# Patient Record
Sex: Female | Born: 1935 | Race: White | Hispanic: No
Health system: Southern US, Community
[De-identification: ages and names within clinical notes are randomized; demographics above are authoritative.]

---

## 1997-10-17 ENCOUNTER — Other Ambulatory Visit: Admission: RE | Admit: 1997-10-17 | Discharge: 1997-10-17 | Payer: Self-pay | Admitting: Gynecology

## 1998-12-18 ENCOUNTER — Other Ambulatory Visit: Admission: RE | Admit: 1998-12-18 | Discharge: 1998-12-18 | Payer: Self-pay | Admitting: Gynecology

## 1999-12-10 ENCOUNTER — Other Ambulatory Visit: Admission: RE | Admit: 1999-12-10 | Discharge: 1999-12-10 | Payer: Self-pay | Admitting: Gynecology

## 2001-03-03 ENCOUNTER — Other Ambulatory Visit: Admission: RE | Admit: 2001-03-03 | Discharge: 2001-03-03 | Payer: Self-pay | Admitting: Gynecology

## 2003-02-08 ENCOUNTER — Ambulatory Visit (HOSPITAL_COMMUNITY): Admission: RE | Admit: 2003-02-08 | Discharge: 2003-02-08 | Payer: Self-pay | Admitting: Surgery

## 2003-02-08 ENCOUNTER — Ambulatory Visit (HOSPITAL_BASED_OUTPATIENT_CLINIC_OR_DEPARTMENT_OTHER): Admission: RE | Admit: 2003-02-08 | Discharge: 2003-02-08 | Payer: Self-pay | Admitting: Surgery

## 2011-04-03 DIAGNOSIS — H40019 Open angle with borderline findings, low risk, unspecified eye: Secondary | ICD-10-CM | POA: Diagnosis not present

## 2011-04-03 DIAGNOSIS — H3530 Unspecified macular degeneration: Secondary | ICD-10-CM | POA: Diagnosis not present

## 2011-04-03 DIAGNOSIS — H269 Unspecified cataract: Secondary | ICD-10-CM | POA: Diagnosis not present

## 2011-04-08 DIAGNOSIS — I4891 Unspecified atrial fibrillation: Secondary | ICD-10-CM | POA: Diagnosis not present

## 2011-08-28 DIAGNOSIS — S61209A Unspecified open wound of unspecified finger without damage to nail, initial encounter: Secondary | ICD-10-CM | POA: Diagnosis not present

## 2011-11-01 DIAGNOSIS — R7309 Other abnormal glucose: Secondary | ICD-10-CM | POA: Diagnosis not present

## 2011-11-01 DIAGNOSIS — E785 Hyperlipidemia, unspecified: Secondary | ICD-10-CM | POA: Diagnosis not present

## 2011-11-01 DIAGNOSIS — R7301 Impaired fasting glucose: Secondary | ICD-10-CM | POA: Diagnosis not present

## 2011-11-01 DIAGNOSIS — Z950 Presence of cardiac pacemaker: Secondary | ICD-10-CM | POA: Diagnosis not present

## 2011-11-01 DIAGNOSIS — I4891 Unspecified atrial fibrillation: Secondary | ICD-10-CM | POA: Diagnosis not present

## 2011-11-01 DIAGNOSIS — I10 Essential (primary) hypertension: Secondary | ICD-10-CM | POA: Diagnosis not present

## 2011-11-01 DIAGNOSIS — E559 Vitamin D deficiency, unspecified: Secondary | ICD-10-CM | POA: Diagnosis not present

## 2012-01-30 DIAGNOSIS — R300 Dysuria: Secondary | ICD-10-CM | POA: Diagnosis not present

## 2012-03-02 DIAGNOSIS — I440 Atrioventricular block, first degree: Secondary | ICD-10-CM | POA: Diagnosis not present

## 2012-03-02 DIAGNOSIS — I4891 Unspecified atrial fibrillation: Secondary | ICD-10-CM | POA: Diagnosis not present

## 2012-03-02 DIAGNOSIS — Z8249 Family history of ischemic heart disease and other diseases of the circulatory system: Secondary | ICD-10-CM | POA: Diagnosis not present

## 2012-03-02 DIAGNOSIS — I10 Essential (primary) hypertension: Secondary | ICD-10-CM | POA: Diagnosis not present

## 2012-03-02 DIAGNOSIS — Z45018 Encounter for adjustment and management of other part of cardiac pacemaker: Secondary | ICD-10-CM | POA: Diagnosis not present

## 2012-03-02 DIAGNOSIS — I491 Atrial premature depolarization: Secondary | ICD-10-CM | POA: Diagnosis not present

## 2012-03-02 DIAGNOSIS — R0789 Other chest pain: Secondary | ICD-10-CM | POA: Diagnosis not present

## 2012-03-04 DIAGNOSIS — R1310 Dysphagia, unspecified: Secondary | ICD-10-CM | POA: Diagnosis not present

## 2012-03-04 DIAGNOSIS — M542 Cervicalgia: Secondary | ICD-10-CM | POA: Diagnosis not present

## 2012-03-04 DIAGNOSIS — R000 Tachycardia, unspecified: Secondary | ICD-10-CM | POA: Diagnosis not present

## 2012-03-16 DIAGNOSIS — I4891 Unspecified atrial fibrillation: Secondary | ICD-10-CM | POA: Diagnosis not present

## 2012-03-16 DIAGNOSIS — I10 Essential (primary) hypertension: Secondary | ICD-10-CM | POA: Diagnosis not present

## 2012-03-16 DIAGNOSIS — I498 Other specified cardiac arrhythmias: Secondary | ICD-10-CM | POA: Diagnosis not present

## 2012-03-16 DIAGNOSIS — Z45018 Encounter for adjustment and management of other part of cardiac pacemaker: Secondary | ICD-10-CM | POA: Diagnosis not present

## 2012-03-16 DIAGNOSIS — Z950 Presence of cardiac pacemaker: Secondary | ICD-10-CM | POA: Diagnosis not present

## 2012-06-25 DIAGNOSIS — Z950 Presence of cardiac pacemaker: Secondary | ICD-10-CM | POA: Diagnosis not present

## 2012-08-06 DIAGNOSIS — I10 Essential (primary) hypertension: Secondary | ICD-10-CM | POA: Diagnosis not present

## 2012-08-06 DIAGNOSIS — R141 Gas pain: Secondary | ICD-10-CM | POA: Diagnosis not present

## 2012-08-06 DIAGNOSIS — I4891 Unspecified atrial fibrillation: Secondary | ICD-10-CM | POA: Diagnosis not present

## 2012-08-06 DIAGNOSIS — R7301 Impaired fasting glucose: Secondary | ICD-10-CM | POA: Diagnosis not present

## 2012-08-06 DIAGNOSIS — E559 Vitamin D deficiency, unspecified: Secondary | ICD-10-CM | POA: Diagnosis not present

## 2012-08-06 DIAGNOSIS — E785 Hyperlipidemia, unspecified: Secondary | ICD-10-CM | POA: Diagnosis not present

## 2012-08-06 DIAGNOSIS — R143 Flatulence: Secondary | ICD-10-CM | POA: Diagnosis not present

## 2012-09-03 DIAGNOSIS — Z1231 Encounter for screening mammogram for malignant neoplasm of breast: Secondary | ICD-10-CM | POA: Diagnosis not present

## 2012-10-26 DIAGNOSIS — Z950 Presence of cardiac pacemaker: Secondary | ICD-10-CM | POA: Diagnosis not present

## 2012-12-03 DIAGNOSIS — H3530 Unspecified macular degeneration: Secondary | ICD-10-CM | POA: Diagnosis not present

## 2012-12-03 DIAGNOSIS — H268 Other specified cataract: Secondary | ICD-10-CM | POA: Diagnosis not present

## 2013-01-19 DIAGNOSIS — R109 Unspecified abdominal pain: Secondary | ICD-10-CM | POA: Diagnosis not present

## 2013-01-26 DIAGNOSIS — Z9889 Other specified postprocedural states: Secondary | ICD-10-CM | POA: Diagnosis not present

## 2013-01-26 DIAGNOSIS — R109 Unspecified abdominal pain: Secondary | ICD-10-CM | POA: Diagnosis not present

## 2013-01-26 DIAGNOSIS — IMO0002 Reserved for concepts with insufficient information to code with codable children: Secondary | ICD-10-CM | POA: Diagnosis not present

## 2013-01-26 DIAGNOSIS — G8929 Other chronic pain: Secondary | ICD-10-CM | POA: Diagnosis not present

## 2013-01-26 DIAGNOSIS — D3500 Benign neoplasm of unspecified adrenal gland: Secondary | ICD-10-CM | POA: Diagnosis not present

## 2013-01-26 DIAGNOSIS — I700 Atherosclerosis of aorta: Secondary | ICD-10-CM | POA: Diagnosis not present

## 2013-01-26 DIAGNOSIS — N289 Disorder of kidney and ureter, unspecified: Secondary | ICD-10-CM | POA: Diagnosis not present

## 2013-02-04 DIAGNOSIS — K7689 Other specified diseases of liver: Secondary | ICD-10-CM | POA: Diagnosis not present

## 2013-02-04 DIAGNOSIS — R109 Unspecified abdominal pain: Secondary | ICD-10-CM | POA: Diagnosis not present

## 2013-02-04 DIAGNOSIS — D3500 Benign neoplasm of unspecified adrenal gland: Secondary | ICD-10-CM | POA: Diagnosis not present

## 2013-02-09 DIAGNOSIS — E119 Type 2 diabetes mellitus without complications: Secondary | ICD-10-CM | POA: Diagnosis not present

## 2013-02-09 DIAGNOSIS — R109 Unspecified abdominal pain: Secondary | ICD-10-CM | POA: Diagnosis not present

## 2013-02-09 DIAGNOSIS — R198 Other specified symptoms and signs involving the digestive system and abdomen: Secondary | ICD-10-CM | POA: Diagnosis not present

## 2013-02-09 DIAGNOSIS — Z006 Encounter for examination for normal comparison and control in clinical research program: Secondary | ICD-10-CM | POA: Diagnosis not present

## 2013-02-22 DIAGNOSIS — N949 Unspecified condition associated with female genital organs and menstrual cycle: Secondary | ICD-10-CM | POA: Diagnosis not present

## 2013-03-05 DIAGNOSIS — R109 Unspecified abdominal pain: Secondary | ICD-10-CM | POA: Diagnosis not present

## 2013-03-05 DIAGNOSIS — F341 Dysthymic disorder: Secondary | ICD-10-CM | POA: Diagnosis not present

## 2013-03-05 DIAGNOSIS — R7309 Other abnormal glucose: Secondary | ICD-10-CM | POA: Diagnosis not present

## 2013-03-05 DIAGNOSIS — Z79899 Other long term (current) drug therapy: Secondary | ICD-10-CM | POA: Diagnosis not present

## 2013-03-11 DIAGNOSIS — Z950 Presence of cardiac pacemaker: Secondary | ICD-10-CM | POA: Diagnosis not present

## 2013-03-11 DIAGNOSIS — I4891 Unspecified atrial fibrillation: Secondary | ICD-10-CM | POA: Diagnosis not present

## 2013-03-11 DIAGNOSIS — I10 Essential (primary) hypertension: Secondary | ICD-10-CM | POA: Diagnosis not present

## 2013-03-11 DIAGNOSIS — I498 Other specified cardiac arrhythmias: Secondary | ICD-10-CM | POA: Diagnosis not present

## 2013-03-17 DIAGNOSIS — N390 Urinary tract infection, site not specified: Secondary | ICD-10-CM | POA: Diagnosis not present

## 2013-03-17 DIAGNOSIS — A499 Bacterial infection, unspecified: Secondary | ICD-10-CM | POA: Diagnosis not present

## 2013-03-23 DIAGNOSIS — N390 Urinary tract infection, site not specified: Secondary | ICD-10-CM | POA: Diagnosis not present

## 2013-03-27 DIAGNOSIS — R509 Fever, unspecified: Secondary | ICD-10-CM | POA: Diagnosis not present

## 2013-03-27 DIAGNOSIS — R059 Cough, unspecified: Secondary | ICD-10-CM | POA: Diagnosis not present

## 2013-03-27 DIAGNOSIS — R0602 Shortness of breath: Secondary | ICD-10-CM | POA: Diagnosis not present

## 2013-03-27 DIAGNOSIS — J069 Acute upper respiratory infection, unspecified: Secondary | ICD-10-CM | POA: Diagnosis not present

## 2013-03-27 DIAGNOSIS — R6889 Other general symptoms and signs: Secondary | ICD-10-CM | POA: Diagnosis not present

## 2013-03-27 DIAGNOSIS — R05 Cough: Secondary | ICD-10-CM | POA: Diagnosis not present

## 2013-04-30 DIAGNOSIS — R3989 Other symptoms and signs involving the genitourinary system: Secondary | ICD-10-CM | POA: Diagnosis not present

## 2013-05-07 DIAGNOSIS — Z1211 Encounter for screening for malignant neoplasm of colon: Secondary | ICD-10-CM | POA: Diagnosis not present

## 2013-05-07 DIAGNOSIS — Z86010 Personal history of colonic polyps: Secondary | ICD-10-CM | POA: Diagnosis not present

## 2013-05-19 DIAGNOSIS — N952 Postmenopausal atrophic vaginitis: Secondary | ICD-10-CM | POA: Diagnosis not present

## 2013-05-19 DIAGNOSIS — R109 Unspecified abdominal pain: Secondary | ICD-10-CM | POA: Diagnosis not present

## 2013-06-01 DIAGNOSIS — H3530 Unspecified macular degeneration: Secondary | ICD-10-CM | POA: Diagnosis not present

## 2013-06-01 DIAGNOSIS — H353190 Nonexudative age-related macular degeneration, unspecified eye, stage unspecified: Secondary | ICD-10-CM | POA: Diagnosis not present

## 2013-06-01 DIAGNOSIS — H268 Other specified cataract: Secondary | ICD-10-CM | POA: Diagnosis not present

## 2013-06-18 DIAGNOSIS — I495 Sick sinus syndrome: Secondary | ICD-10-CM | POA: Diagnosis not present

## 2013-06-28 DIAGNOSIS — Z86010 Personal history of colonic polyps: Secondary | ICD-10-CM | POA: Diagnosis not present

## 2013-06-28 DIAGNOSIS — Z1211 Encounter for screening for malignant neoplasm of colon: Secondary | ICD-10-CM | POA: Diagnosis not present

## 2013-06-28 DIAGNOSIS — K5730 Diverticulosis of large intestine without perforation or abscess without bleeding: Secondary | ICD-10-CM | POA: Diagnosis not present

## 2013-06-28 DIAGNOSIS — D126 Benign neoplasm of colon, unspecified: Secondary | ICD-10-CM | POA: Diagnosis not present

## 2013-06-30 DIAGNOSIS — IMO0002 Reserved for concepts with insufficient information to code with codable children: Secondary | ICD-10-CM | POA: Diagnosis not present

## 2013-08-06 DIAGNOSIS — I10 Essential (primary) hypertension: Secondary | ICD-10-CM | POA: Diagnosis not present

## 2013-08-06 DIAGNOSIS — N949 Unspecified condition associated with female genital organs and menstrual cycle: Secondary | ICD-10-CM | POA: Diagnosis not present

## 2013-08-06 DIAGNOSIS — R000 Tachycardia, unspecified: Secondary | ICD-10-CM | POA: Diagnosis not present

## 2013-08-06 DIAGNOSIS — R7309 Other abnormal glucose: Secondary | ICD-10-CM | POA: Diagnosis not present

## 2013-08-06 DIAGNOSIS — E559 Vitamin D deficiency, unspecified: Secondary | ICD-10-CM | POA: Diagnosis not present

## 2013-08-06 DIAGNOSIS — R5381 Other malaise: Secondary | ICD-10-CM | POA: Diagnosis not present

## 2013-08-06 DIAGNOSIS — F341 Dysthymic disorder: Secondary | ICD-10-CM | POA: Diagnosis not present

## 2013-08-06 DIAGNOSIS — D3500 Benign neoplasm of unspecified adrenal gland: Secondary | ICD-10-CM | POA: Diagnosis not present

## 2013-08-06 DIAGNOSIS — E7800 Pure hypercholesterolemia, unspecified: Secondary | ICD-10-CM | POA: Diagnosis not present

## 2013-09-03 DIAGNOSIS — K7689 Other specified diseases of liver: Secondary | ICD-10-CM | POA: Diagnosis not present

## 2013-09-03 DIAGNOSIS — N289 Disorder of kidney and ureter, unspecified: Secondary | ICD-10-CM | POA: Diagnosis not present

## 2013-10-08 DIAGNOSIS — I495 Sick sinus syndrome: Secondary | ICD-10-CM | POA: Diagnosis not present

## 2013-10-15 DIAGNOSIS — E7800 Pure hypercholesterolemia, unspecified: Secondary | ICD-10-CM | POA: Diagnosis not present

## 2013-10-15 DIAGNOSIS — E559 Vitamin D deficiency, unspecified: Secondary | ICD-10-CM | POA: Diagnosis not present

## 2013-10-29 DIAGNOSIS — Z1231 Encounter for screening mammogram for malignant neoplasm of breast: Secondary | ICD-10-CM | POA: Diagnosis not present

## 2013-12-14 DIAGNOSIS — R109 Unspecified abdominal pain: Secondary | ICD-10-CM | POA: Diagnosis not present

## 2013-12-14 DIAGNOSIS — I10 Essential (primary) hypertension: Secondary | ICD-10-CM | POA: Diagnosis not present

## 2013-12-14 DIAGNOSIS — R110 Nausea: Secondary | ICD-10-CM | POA: Diagnosis not present

## 2013-12-30 DIAGNOSIS — H3530 Unspecified macular degeneration: Secondary | ICD-10-CM | POA: Diagnosis not present

## 2013-12-30 DIAGNOSIS — H25813 Combined forms of age-related cataract, bilateral: Secondary | ICD-10-CM | POA: Diagnosis not present

## 2013-12-30 DIAGNOSIS — H2589 Other age-related cataract: Secondary | ICD-10-CM | POA: Diagnosis not present

## 2014-01-14 DIAGNOSIS — I495 Sick sinus syndrome: Secondary | ICD-10-CM | POA: Diagnosis not present

## 2014-05-28 DIAGNOSIS — J209 Acute bronchitis, unspecified: Secondary | ICD-10-CM | POA: Diagnosis not present

## 2014-06-17 DIAGNOSIS — Z950 Presence of cardiac pacemaker: Secondary | ICD-10-CM | POA: Diagnosis not present

## 2014-06-17 DIAGNOSIS — R001 Bradycardia, unspecified: Secondary | ICD-10-CM | POA: Diagnosis not present

## 2014-06-17 DIAGNOSIS — I480 Paroxysmal atrial fibrillation: Secondary | ICD-10-CM | POA: Diagnosis not present

## 2014-06-17 DIAGNOSIS — I471 Supraventricular tachycardia: Secondary | ICD-10-CM | POA: Diagnosis not present

## 2014-06-17 DIAGNOSIS — I10 Essential (primary) hypertension: Secondary | ICD-10-CM | POA: Diagnosis not present

## 2014-06-22 DIAGNOSIS — E559 Vitamin D deficiency, unspecified: Secondary | ICD-10-CM | POA: Diagnosis not present

## 2014-06-22 DIAGNOSIS — R413 Other amnesia: Secondary | ICD-10-CM | POA: Diagnosis not present

## 2014-06-22 DIAGNOSIS — E780 Pure hypercholesterolemia: Secondary | ICD-10-CM | POA: Diagnosis not present

## 2014-06-22 DIAGNOSIS — Z79899 Other long term (current) drug therapy: Secondary | ICD-10-CM | POA: Diagnosis not present

## 2014-06-22 DIAGNOSIS — F418 Other specified anxiety disorders: Secondary | ICD-10-CM | POA: Diagnosis not present

## 2014-06-22 DIAGNOSIS — Z9181 History of falling: Secondary | ICD-10-CM | POA: Diagnosis not present

## 2014-06-22 DIAGNOSIS — I10 Essential (primary) hypertension: Secondary | ICD-10-CM | POA: Diagnosis not present

## 2014-06-22 DIAGNOSIS — R7309 Other abnormal glucose: Secondary | ICD-10-CM | POA: Diagnosis not present

## 2014-06-22 DIAGNOSIS — Z1389 Encounter for screening for other disorder: Secondary | ICD-10-CM | POA: Diagnosis not present

## 2014-07-29 DIAGNOSIS — G3184 Mild cognitive impairment, so stated: Secondary | ICD-10-CM | POA: Diagnosis not present

## 2014-07-29 DIAGNOSIS — Z6828 Body mass index (BMI) 28.0-28.9, adult: Secondary | ICD-10-CM | POA: Diagnosis not present

## 2014-11-11 DIAGNOSIS — I4891 Unspecified atrial fibrillation: Secondary | ICD-10-CM | POA: Diagnosis not present

## 2014-11-11 DIAGNOSIS — E780 Pure hypercholesterolemia: Secondary | ICD-10-CM | POA: Diagnosis not present

## 2014-11-11 DIAGNOSIS — Z9181 History of falling: Secondary | ICD-10-CM | POA: Diagnosis not present

## 2014-11-11 DIAGNOSIS — D692 Other nonthrombocytopenic purpura: Secondary | ICD-10-CM | POA: Diagnosis not present

## 2014-11-11 DIAGNOSIS — I10 Essential (primary) hypertension: Secondary | ICD-10-CM | POA: Diagnosis not present

## 2014-11-11 DIAGNOSIS — E559 Vitamin D deficiency, unspecified: Secondary | ICD-10-CM | POA: Diagnosis not present

## 2014-11-11 DIAGNOSIS — F418 Other specified anxiety disorders: Secondary | ICD-10-CM | POA: Diagnosis not present

## 2014-11-11 DIAGNOSIS — G3184 Mild cognitive impairment, so stated: Secondary | ICD-10-CM | POA: Diagnosis not present

## 2014-11-11 DIAGNOSIS — Z79899 Other long term (current) drug therapy: Secondary | ICD-10-CM | POA: Diagnosis not present

## 2014-11-18 DIAGNOSIS — Z1231 Encounter for screening mammogram for malignant neoplasm of breast: Secondary | ICD-10-CM | POA: Diagnosis not present

## 2014-11-25 DIAGNOSIS — E875 Hyperkalemia: Secondary | ICD-10-CM | POA: Diagnosis not present

## 2014-12-29 DIAGNOSIS — Z6828 Body mass index (BMI) 28.0-28.9, adult: Secondary | ICD-10-CM | POA: Diagnosis not present

## 2014-12-29 DIAGNOSIS — R1013 Epigastric pain: Secondary | ICD-10-CM | POA: Diagnosis not present

## 2014-12-29 DIAGNOSIS — R110 Nausea: Secondary | ICD-10-CM | POA: Diagnosis not present

## 2015-01-05 DIAGNOSIS — H3530 Unspecified macular degeneration: Secondary | ICD-10-CM | POA: Diagnosis not present

## 2015-01-05 DIAGNOSIS — H2589 Other age-related cataract: Secondary | ICD-10-CM | POA: Diagnosis not present

## 2015-01-05 DIAGNOSIS — H25813 Combined forms of age-related cataract, bilateral: Secondary | ICD-10-CM | POA: Diagnosis not present

## 2015-01-10 DIAGNOSIS — I495 Sick sinus syndrome: Secondary | ICD-10-CM | POA: Diagnosis not present

## 2015-01-27 DIAGNOSIS — H6123 Impacted cerumen, bilateral: Secondary | ICD-10-CM | POA: Diagnosis not present

## 2015-01-27 DIAGNOSIS — H81399 Other peripheral vertigo, unspecified ear: Secondary | ICD-10-CM | POA: Diagnosis not present

## 2015-05-19 DIAGNOSIS — Z6828 Body mass index (BMI) 28.0-28.9, adult: Secondary | ICD-10-CM | POA: Diagnosis not present

## 2015-05-19 DIAGNOSIS — I10 Essential (primary) hypertension: Secondary | ICD-10-CM | POA: Diagnosis not present

## 2015-05-19 DIAGNOSIS — F418 Other specified anxiety disorders: Secondary | ICD-10-CM | POA: Diagnosis not present

## 2015-05-19 DIAGNOSIS — R7303 Prediabetes: Secondary | ICD-10-CM | POA: Diagnosis not present

## 2015-05-19 DIAGNOSIS — E559 Vitamin D deficiency, unspecified: Secondary | ICD-10-CM | POA: Diagnosis not present

## 2015-05-19 DIAGNOSIS — I4891 Unspecified atrial fibrillation: Secondary | ICD-10-CM | POA: Diagnosis not present

## 2015-05-19 DIAGNOSIS — E7800 Pure hypercholesterolemia, unspecified: Secondary | ICD-10-CM | POA: Diagnosis not present

## 2015-05-19 DIAGNOSIS — G3184 Mild cognitive impairment, so stated: Secondary | ICD-10-CM | POA: Diagnosis not present

## 2015-05-19 DIAGNOSIS — Z79899 Other long term (current) drug therapy: Secondary | ICD-10-CM | POA: Diagnosis not present

## 2015-06-19 DIAGNOSIS — I10 Essential (primary) hypertension: Secondary | ICD-10-CM | POA: Diagnosis not present

## 2015-06-19 DIAGNOSIS — I481 Persistent atrial fibrillation: Secondary | ICD-10-CM | POA: Diagnosis not present

## 2015-06-19 DIAGNOSIS — I499 Cardiac arrhythmia, unspecified: Secondary | ICD-10-CM | POA: Diagnosis not present

## 2015-06-19 DIAGNOSIS — I471 Supraventricular tachycardia: Secondary | ICD-10-CM | POA: Diagnosis not present

## 2015-07-07 DIAGNOSIS — N390 Urinary tract infection, site not specified: Secondary | ICD-10-CM | POA: Diagnosis not present

## 2015-07-07 DIAGNOSIS — Z6828 Body mass index (BMI) 28.0-28.9, adult: Secondary | ICD-10-CM | POA: Diagnosis not present

## 2015-07-07 DIAGNOSIS — R1031 Right lower quadrant pain: Secondary | ICD-10-CM | POA: Diagnosis not present

## 2015-07-07 DIAGNOSIS — Z1389 Encounter for screening for other disorder: Secondary | ICD-10-CM | POA: Diagnosis not present

## 2015-07-11 DIAGNOSIS — R1031 Right lower quadrant pain: Secondary | ICD-10-CM | POA: Diagnosis not present

## 2015-09-29 DIAGNOSIS — R1010 Upper abdominal pain, unspecified: Secondary | ICD-10-CM | POA: Diagnosis not present

## 2015-09-29 DIAGNOSIS — R1084 Generalized abdominal pain: Secondary | ICD-10-CM | POA: Diagnosis not present

## 2015-10-10 DIAGNOSIS — I495 Sick sinus syndrome: Secondary | ICD-10-CM | POA: Diagnosis not present

## 2015-11-03 DIAGNOSIS — F0280 Dementia in other diseases classified elsewhere without behavioral disturbance: Secondary | ICD-10-CM | POA: Diagnosis not present

## 2015-11-03 DIAGNOSIS — G308 Other Alzheimer's disease: Secondary | ICD-10-CM | POA: Diagnosis not present

## 2015-11-21 DIAGNOSIS — G3184 Mild cognitive impairment, so stated: Secondary | ICD-10-CM | POA: Diagnosis not present

## 2015-11-21 DIAGNOSIS — Z1231 Encounter for screening mammogram for malignant neoplasm of breast: Secondary | ICD-10-CM | POA: Diagnosis not present

## 2015-11-21 DIAGNOSIS — Z6828 Body mass index (BMI) 28.0-28.9, adult: Secondary | ICD-10-CM | POA: Diagnosis not present

## 2015-11-21 DIAGNOSIS — F418 Other specified anxiety disorders: Secondary | ICD-10-CM | POA: Diagnosis not present

## 2015-11-21 DIAGNOSIS — Z9181 History of falling: Secondary | ICD-10-CM | POA: Diagnosis not present

## 2015-11-21 DIAGNOSIS — I4891 Unspecified atrial fibrillation: Secondary | ICD-10-CM | POA: Diagnosis not present

## 2015-11-21 DIAGNOSIS — I10 Essential (primary) hypertension: Secondary | ICD-10-CM | POA: Diagnosis not present

## 2015-11-21 DIAGNOSIS — Z79899 Other long term (current) drug therapy: Secondary | ICD-10-CM | POA: Diagnosis not present

## 2015-11-21 DIAGNOSIS — E663 Overweight: Secondary | ICD-10-CM | POA: Diagnosis not present

## 2015-11-21 DIAGNOSIS — E559 Vitamin D deficiency, unspecified: Secondary | ICD-10-CM | POA: Diagnosis not present

## 2015-11-21 DIAGNOSIS — E7800 Pure hypercholesterolemia, unspecified: Secondary | ICD-10-CM | POA: Diagnosis not present

## 2016-01-11 DIAGNOSIS — H353131 Nonexudative age-related macular degeneration, bilateral, early dry stage: Secondary | ICD-10-CM | POA: Diagnosis not present

## 2016-01-11 DIAGNOSIS — H2589 Other age-related cataract: Secondary | ICD-10-CM | POA: Diagnosis not present

## 2016-01-11 DIAGNOSIS — H25813 Combined forms of age-related cataract, bilateral: Secondary | ICD-10-CM | POA: Diagnosis not present

## 2016-01-17 DIAGNOSIS — K7689 Other specified diseases of liver: Secondary | ICD-10-CM | POA: Diagnosis not present

## 2016-01-17 DIAGNOSIS — R1011 Right upper quadrant pain: Secondary | ICD-10-CM | POA: Diagnosis not present

## 2016-01-17 DIAGNOSIS — R1013 Epigastric pain: Secondary | ICD-10-CM | POA: Diagnosis not present

## 2016-01-17 DIAGNOSIS — Z79899 Other long term (current) drug therapy: Secondary | ICD-10-CM | POA: Diagnosis not present

## 2016-01-17 DIAGNOSIS — Z6828 Body mass index (BMI) 28.0-28.9, adult: Secondary | ICD-10-CM | POA: Diagnosis not present

## 2016-01-22 DIAGNOSIS — R1013 Epigastric pain: Secondary | ICD-10-CM | POA: Diagnosis not present

## 2016-01-22 DIAGNOSIS — R1011 Right upper quadrant pain: Secondary | ICD-10-CM | POA: Diagnosis not present

## 2016-01-22 DIAGNOSIS — R109 Unspecified abdominal pain: Secondary | ICD-10-CM | POA: Diagnosis not present

## 2016-01-29 DIAGNOSIS — R1013 Epigastric pain: Secondary | ICD-10-CM | POA: Diagnosis not present

## 2016-01-29 DIAGNOSIS — K828 Other specified diseases of gallbladder: Secondary | ICD-10-CM | POA: Diagnosis not present

## 2016-01-31 DIAGNOSIS — I480 Paroxysmal atrial fibrillation: Secondary | ICD-10-CM | POA: Diagnosis not present

## 2016-01-31 DIAGNOSIS — I495 Sick sinus syndrome: Secondary | ICD-10-CM | POA: Diagnosis not present

## 2016-01-31 DIAGNOSIS — Z01818 Encounter for other preprocedural examination: Secondary | ICD-10-CM | POA: Diagnosis not present

## 2016-01-31 DIAGNOSIS — I471 Supraventricular tachycardia: Secondary | ICD-10-CM | POA: Diagnosis not present

## 2016-01-31 DIAGNOSIS — I10 Essential (primary) hypertension: Secondary | ICD-10-CM | POA: Diagnosis not present

## 2016-01-31 DIAGNOSIS — Z950 Presence of cardiac pacemaker: Secondary | ICD-10-CM | POA: Diagnosis not present

## 2016-02-07 DIAGNOSIS — F329 Major depressive disorder, single episode, unspecified: Secondary | ICD-10-CM | POA: Diagnosis not present

## 2016-02-07 DIAGNOSIS — F419 Anxiety disorder, unspecified: Secondary | ICD-10-CM | POA: Diagnosis not present

## 2016-02-07 DIAGNOSIS — F0390 Unspecified dementia without behavioral disturbance: Secondary | ICD-10-CM | POA: Diagnosis not present

## 2016-02-07 DIAGNOSIS — E7800 Pure hypercholesterolemia, unspecified: Secondary | ICD-10-CM | POA: Diagnosis not present

## 2016-02-07 DIAGNOSIS — Z87891 Personal history of nicotine dependence: Secondary | ICD-10-CM | POA: Diagnosis not present

## 2016-02-07 DIAGNOSIS — Z950 Presence of cardiac pacemaker: Secondary | ICD-10-CM | POA: Diagnosis not present

## 2016-02-07 DIAGNOSIS — I4891 Unspecified atrial fibrillation: Secondary | ICD-10-CM | POA: Diagnosis not present

## 2016-02-07 DIAGNOSIS — K810 Acute cholecystitis: Secondary | ICD-10-CM | POA: Diagnosis not present

## 2016-02-07 DIAGNOSIS — K828 Other specified diseases of gallbladder: Secondary | ICD-10-CM | POA: Diagnosis not present

## 2016-02-07 DIAGNOSIS — Z79899 Other long term (current) drug therapy: Secondary | ICD-10-CM | POA: Diagnosis not present

## 2016-02-07 DIAGNOSIS — I10 Essential (primary) hypertension: Secondary | ICD-10-CM | POA: Diagnosis not present

## 2016-02-07 DIAGNOSIS — K811 Chronic cholecystitis: Secondary | ICD-10-CM | POA: Diagnosis not present

## 2016-03-01 DIAGNOSIS — H2511 Age-related nuclear cataract, right eye: Secondary | ICD-10-CM | POA: Diagnosis not present

## 2016-05-03 DIAGNOSIS — R140 Abdominal distension (gaseous): Secondary | ICD-10-CM | POA: Diagnosis not present

## 2016-05-03 DIAGNOSIS — R1084 Generalized abdominal pain: Secondary | ICD-10-CM | POA: Diagnosis not present

## 2016-05-08 DIAGNOSIS — G308 Other Alzheimer's disease: Secondary | ICD-10-CM | POA: Diagnosis not present

## 2016-05-08 DIAGNOSIS — F0280 Dementia in other diseases classified elsewhere without behavioral disturbance: Secondary | ICD-10-CM | POA: Diagnosis not present

## 2016-05-23 DIAGNOSIS — G3184 Mild cognitive impairment, so stated: Secondary | ICD-10-CM | POA: Diagnosis not present

## 2016-05-23 DIAGNOSIS — E7800 Pure hypercholesterolemia, unspecified: Secondary | ICD-10-CM | POA: Diagnosis not present

## 2016-05-23 DIAGNOSIS — F418 Other specified anxiety disorders: Secondary | ICD-10-CM | POA: Diagnosis not present

## 2016-05-23 DIAGNOSIS — E559 Vitamin D deficiency, unspecified: Secondary | ICD-10-CM | POA: Diagnosis not present

## 2016-05-23 DIAGNOSIS — I4891 Unspecified atrial fibrillation: Secondary | ICD-10-CM | POA: Diagnosis not present

## 2016-05-23 DIAGNOSIS — I10 Essential (primary) hypertension: Secondary | ICD-10-CM | POA: Diagnosis not present

## 2016-05-28 DIAGNOSIS — R1013 Epigastric pain: Secondary | ICD-10-CM | POA: Diagnosis not present

## 2016-05-29 DIAGNOSIS — R1013 Epigastric pain: Secondary | ICD-10-CM | POA: Diagnosis not present

## 2016-05-31 ENCOUNTER — Encounter: Payer: Self-pay | Admitting: Gastroenterology

## 2016-06-11 ENCOUNTER — Ambulatory Visit: Payer: Self-pay | Admitting: Gastroenterology

## 2016-06-18 DIAGNOSIS — G308 Other Alzheimer's disease: Secondary | ICD-10-CM | POA: Diagnosis not present

## 2016-06-18 DIAGNOSIS — I480 Paroxysmal atrial fibrillation: Secondary | ICD-10-CM | POA: Diagnosis not present

## 2016-06-18 DIAGNOSIS — R001 Bradycardia, unspecified: Secondary | ICD-10-CM | POA: Diagnosis not present

## 2016-06-18 DIAGNOSIS — I471 Supraventricular tachycardia: Secondary | ICD-10-CM | POA: Diagnosis not present

## 2016-06-18 DIAGNOSIS — I499 Cardiac arrhythmia, unspecified: Secondary | ICD-10-CM | POA: Diagnosis not present

## 2016-06-18 DIAGNOSIS — E7800 Pure hypercholesterolemia, unspecified: Secondary | ICD-10-CM | POA: Diagnosis not present

## 2016-06-18 DIAGNOSIS — Z950 Presence of cardiac pacemaker: Secondary | ICD-10-CM | POA: Diagnosis not present

## 2016-06-18 DIAGNOSIS — E785 Hyperlipidemia, unspecified: Secondary | ICD-10-CM | POA: Diagnosis not present

## 2016-06-18 DIAGNOSIS — F0280 Dementia in other diseases classified elsewhere without behavioral disturbance: Secondary | ICD-10-CM | POA: Diagnosis not present

## 2016-06-18 DIAGNOSIS — I495 Sick sinus syndrome: Secondary | ICD-10-CM | POA: Diagnosis not present

## 2016-06-18 DIAGNOSIS — I10 Essential (primary) hypertension: Secondary | ICD-10-CM | POA: Diagnosis not present

## 2016-08-14 DIAGNOSIS — I10 Essential (primary) hypertension: Secondary | ICD-10-CM | POA: Diagnosis not present

## 2016-08-14 DIAGNOSIS — Z6828 Body mass index (BMI) 28.0-28.9, adult: Secondary | ICD-10-CM | POA: Diagnosis not present

## 2016-08-14 DIAGNOSIS — R109 Unspecified abdominal pain: Secondary | ICD-10-CM | POA: Diagnosis not present

## 2016-08-14 DIAGNOSIS — Z9181 History of falling: Secondary | ICD-10-CM | POA: Diagnosis not present

## 2016-08-14 DIAGNOSIS — F418 Other specified anxiety disorders: Secondary | ICD-10-CM | POA: Diagnosis not present

## 2016-08-14 DIAGNOSIS — Z1389 Encounter for screening for other disorder: Secondary | ICD-10-CM | POA: Diagnosis not present

## 2016-08-25 DIAGNOSIS — T7840XA Allergy, unspecified, initial encounter: Secondary | ICD-10-CM | POA: Diagnosis not present

## 2016-08-25 DIAGNOSIS — L2089 Other atopic dermatitis: Secondary | ICD-10-CM | POA: Diagnosis not present

## 2016-08-26 DIAGNOSIS — Z79899 Other long term (current) drug therapy: Secondary | ICD-10-CM | POA: Diagnosis not present

## 2016-08-26 DIAGNOSIS — D72829 Elevated white blood cell count, unspecified: Secondary | ICD-10-CM | POA: Diagnosis not present

## 2016-08-26 DIAGNOSIS — T7840XA Allergy, unspecified, initial encounter: Secondary | ICD-10-CM | POA: Diagnosis not present

## 2016-08-26 DIAGNOSIS — Z7952 Long term (current) use of systemic steroids: Secondary | ICD-10-CM | POA: Diagnosis not present

## 2016-08-26 DIAGNOSIS — I10 Essential (primary) hypertension: Secondary | ICD-10-CM | POA: Diagnosis not present

## 2016-08-26 DIAGNOSIS — R21 Rash and other nonspecific skin eruption: Secondary | ICD-10-CM | POA: Diagnosis not present

## 2016-08-26 DIAGNOSIS — Z7982 Long term (current) use of aspirin: Secondary | ICD-10-CM | POA: Diagnosis not present

## 2016-08-27 DIAGNOSIS — I10 Essential (primary) hypertension: Secondary | ICD-10-CM | POA: Diagnosis not present

## 2016-08-27 DIAGNOSIS — Z8639 Personal history of other endocrine, nutritional and metabolic disease: Secondary | ICD-10-CM | POA: Diagnosis not present

## 2016-08-27 DIAGNOSIS — Z8659 Personal history of other mental and behavioral disorders: Secondary | ICD-10-CM | POA: Diagnosis not present

## 2016-08-27 DIAGNOSIS — T7840XA Allergy, unspecified, initial encounter: Secondary | ICD-10-CM | POA: Diagnosis not present

## 2016-08-27 DIAGNOSIS — Z8669 Personal history of other diseases of the nervous system and sense organs: Secondary | ICD-10-CM | POA: Diagnosis not present

## 2016-08-27 DIAGNOSIS — R21 Rash and other nonspecific skin eruption: Secondary | ICD-10-CM | POA: Diagnosis not present

## 2016-09-03 DIAGNOSIS — F418 Other specified anxiety disorders: Secondary | ICD-10-CM | POA: Diagnosis not present

## 2016-09-03 DIAGNOSIS — Z6827 Body mass index (BMI) 27.0-27.9, adult: Secondary | ICD-10-CM | POA: Diagnosis not present

## 2016-09-03 DIAGNOSIS — T783XXA Angioneurotic edema, initial encounter: Secondary | ICD-10-CM | POA: Diagnosis not present

## 2016-09-09 DIAGNOSIS — Z136 Encounter for screening for cardiovascular disorders: Secondary | ICD-10-CM | POA: Diagnosis not present

## 2016-09-09 DIAGNOSIS — Z9181 History of falling: Secondary | ICD-10-CM | POA: Diagnosis not present

## 2016-09-09 DIAGNOSIS — Z23 Encounter for immunization: Secondary | ICD-10-CM | POA: Diagnosis not present

## 2016-09-09 DIAGNOSIS — N959 Unspecified menopausal and perimenopausal disorder: Secondary | ICD-10-CM | POA: Diagnosis not present

## 2016-09-09 DIAGNOSIS — F329 Major depressive disorder, single episode, unspecified: Secondary | ICD-10-CM | POA: Diagnosis not present

## 2016-09-09 DIAGNOSIS — E785 Hyperlipidemia, unspecified: Secondary | ICD-10-CM | POA: Diagnosis not present

## 2016-09-09 DIAGNOSIS — Z0000 Encounter for general adult medical examination without abnormal findings: Secondary | ICD-10-CM | POA: Diagnosis not present

## 2016-09-09 DIAGNOSIS — Z1389 Encounter for screening for other disorder: Secondary | ICD-10-CM | POA: Diagnosis not present

## 2016-09-09 DIAGNOSIS — Z1231 Encounter for screening mammogram for malignant neoplasm of breast: Secondary | ICD-10-CM | POA: Diagnosis not present

## 2016-09-27 DIAGNOSIS — M8580 Other specified disorders of bone density and structure, unspecified site: Secondary | ICD-10-CM | POA: Diagnosis not present

## 2016-09-27 DIAGNOSIS — Z780 Asymptomatic menopausal state: Secondary | ICD-10-CM | POA: Diagnosis not present

## 2016-09-27 DIAGNOSIS — Z1231 Encounter for screening mammogram for malignant neoplasm of breast: Secondary | ICD-10-CM | POA: Diagnosis not present

## 2016-09-27 DIAGNOSIS — M85852 Other specified disorders of bone density and structure, left thigh: Secondary | ICD-10-CM | POA: Diagnosis not present

## 2016-10-17 DIAGNOSIS — H25812 Combined forms of age-related cataract, left eye: Secondary | ICD-10-CM | POA: Diagnosis not present

## 2016-10-17 DIAGNOSIS — H268 Other specified cataract: Secondary | ICD-10-CM | POA: Diagnosis not present

## 2016-11-01 DIAGNOSIS — Z950 Presence of cardiac pacemaker: Secondary | ICD-10-CM | POA: Diagnosis not present

## 2016-11-01 DIAGNOSIS — I10 Essential (primary) hypertension: Secondary | ICD-10-CM | POA: Diagnosis not present

## 2016-11-01 DIAGNOSIS — K219 Gastro-esophageal reflux disease without esophagitis: Secondary | ICD-10-CM | POA: Diagnosis not present

## 2016-11-01 DIAGNOSIS — Z79899 Other long term (current) drug therapy: Secondary | ICD-10-CM | POA: Diagnosis not present

## 2016-11-01 DIAGNOSIS — F0280 Dementia in other diseases classified elsewhere without behavioral disturbance: Secondary | ICD-10-CM | POA: Diagnosis not present

## 2016-11-01 DIAGNOSIS — Z7982 Long term (current) use of aspirin: Secondary | ICD-10-CM | POA: Diagnosis not present

## 2016-11-01 DIAGNOSIS — H2512 Age-related nuclear cataract, left eye: Secondary | ICD-10-CM | POA: Diagnosis not present

## 2016-11-01 DIAGNOSIS — G309 Alzheimer's disease, unspecified: Secondary | ICD-10-CM | POA: Diagnosis not present

## 2016-11-01 DIAGNOSIS — I4510 Unspecified right bundle-branch block: Secondary | ICD-10-CM | POA: Diagnosis not present

## 2016-11-01 DIAGNOSIS — Z87891 Personal history of nicotine dependence: Secondary | ICD-10-CM | POA: Diagnosis not present

## 2016-11-01 DIAGNOSIS — I4891 Unspecified atrial fibrillation: Secondary | ICD-10-CM | POA: Diagnosis not present

## 2016-11-06 DIAGNOSIS — F0280 Dementia in other diseases classified elsewhere without behavioral disturbance: Secondary | ICD-10-CM | POA: Diagnosis not present

## 2016-11-06 DIAGNOSIS — F329 Major depressive disorder, single episode, unspecified: Secondary | ICD-10-CM | POA: Diagnosis not present

## 2016-11-06 DIAGNOSIS — G301 Alzheimer's disease with late onset: Secondary | ICD-10-CM | POA: Diagnosis not present

## 2016-11-06 DIAGNOSIS — F411 Generalized anxiety disorder: Secondary | ICD-10-CM | POA: Diagnosis not present

## 2016-11-06 DIAGNOSIS — G4719 Other hypersomnia: Secondary | ICD-10-CM | POA: Diagnosis not present

## 2016-11-25 DIAGNOSIS — G3184 Mild cognitive impairment, so stated: Secondary | ICD-10-CM | POA: Diagnosis not present

## 2016-11-25 DIAGNOSIS — E7800 Pure hypercholesterolemia, unspecified: Secondary | ICD-10-CM | POA: Diagnosis not present

## 2016-11-25 DIAGNOSIS — E559 Vitamin D deficiency, unspecified: Secondary | ICD-10-CM | POA: Diagnosis not present

## 2016-11-25 DIAGNOSIS — I4891 Unspecified atrial fibrillation: Secondary | ICD-10-CM | POA: Diagnosis not present

## 2016-11-25 DIAGNOSIS — Z6827 Body mass index (BMI) 27.0-27.9, adult: Secondary | ICD-10-CM | POA: Diagnosis not present

## 2016-11-25 DIAGNOSIS — Z79899 Other long term (current) drug therapy: Secondary | ICD-10-CM | POA: Diagnosis not present

## 2016-11-25 DIAGNOSIS — F418 Other specified anxiety disorders: Secondary | ICD-10-CM | POA: Diagnosis not present

## 2016-12-24 DIAGNOSIS — G4719 Other hypersomnia: Secondary | ICD-10-CM | POA: Diagnosis not present

## 2016-12-24 DIAGNOSIS — G301 Alzheimer's disease with late onset: Secondary | ICD-10-CM | POA: Diagnosis not present

## 2016-12-24 DIAGNOSIS — G3184 Mild cognitive impairment, so stated: Secondary | ICD-10-CM | POA: Diagnosis not present

## 2016-12-24 DIAGNOSIS — F0280 Dementia in other diseases classified elsewhere without behavioral disturbance: Secondary | ICD-10-CM | POA: Diagnosis not present

## 2016-12-24 DIAGNOSIS — F411 Generalized anxiety disorder: Secondary | ICD-10-CM | POA: Diagnosis not present

## 2017-02-21 DIAGNOSIS — Z6826 Body mass index (BMI) 26.0-26.9, adult: Secondary | ICD-10-CM | POA: Diagnosis not present

## 2017-02-21 DIAGNOSIS — J189 Pneumonia, unspecified organism: Secondary | ICD-10-CM | POA: Diagnosis not present

## 2017-05-16 DIAGNOSIS — Z950 Presence of cardiac pacemaker: Secondary | ICD-10-CM | POA: Diagnosis not present

## 2017-05-16 DIAGNOSIS — I495 Sick sinus syndrome: Secondary | ICD-10-CM | POA: Diagnosis not present

## 2017-05-26 DIAGNOSIS — G308 Other Alzheimer's disease: Secondary | ICD-10-CM | POA: Diagnosis not present

## 2017-05-26 DIAGNOSIS — G3184 Mild cognitive impairment, so stated: Secondary | ICD-10-CM | POA: Diagnosis not present

## 2017-05-26 DIAGNOSIS — G4719 Other hypersomnia: Secondary | ICD-10-CM | POA: Diagnosis not present

## 2017-05-26 DIAGNOSIS — F411 Generalized anxiety disorder: Secondary | ICD-10-CM | POA: Diagnosis not present

## 2017-05-26 DIAGNOSIS — F329 Major depressive disorder, single episode, unspecified: Secondary | ICD-10-CM | POA: Diagnosis not present

## 2017-05-26 DIAGNOSIS — F0280 Dementia in other diseases classified elsewhere without behavioral disturbance: Secondary | ICD-10-CM | POA: Diagnosis not present

## 2017-06-02 DIAGNOSIS — E7800 Pure hypercholesterolemia, unspecified: Secondary | ICD-10-CM | POA: Diagnosis not present

## 2017-06-02 DIAGNOSIS — G3184 Mild cognitive impairment, so stated: Secondary | ICD-10-CM | POA: Diagnosis not present

## 2017-06-02 DIAGNOSIS — E559 Vitamin D deficiency, unspecified: Secondary | ICD-10-CM | POA: Diagnosis not present

## 2017-06-02 DIAGNOSIS — F418 Other specified anxiety disorders: Secondary | ICD-10-CM | POA: Diagnosis not present

## 2017-06-02 DIAGNOSIS — I10 Essential (primary) hypertension: Secondary | ICD-10-CM | POA: Diagnosis not present

## 2017-06-02 DIAGNOSIS — Z79899 Other long term (current) drug therapy: Secondary | ICD-10-CM | POA: Diagnosis not present

## 2017-06-02 DIAGNOSIS — R0981 Nasal congestion: Secondary | ICD-10-CM | POA: Diagnosis not present

## 2017-06-02 DIAGNOSIS — I4891 Unspecified atrial fibrillation: Secondary | ICD-10-CM | POA: Diagnosis not present

## 2017-06-02 DIAGNOSIS — R1013 Epigastric pain: Secondary | ICD-10-CM | POA: Diagnosis not present

## 2017-06-04 DIAGNOSIS — Z79899 Other long term (current) drug therapy: Secondary | ICD-10-CM | POA: Diagnosis not present

## 2017-06-19 DIAGNOSIS — H3530 Unspecified macular degeneration: Secondary | ICD-10-CM | POA: Diagnosis not present

## 2017-06-19 DIAGNOSIS — H353131 Nonexudative age-related macular degeneration, bilateral, early dry stage: Secondary | ICD-10-CM | POA: Diagnosis not present

## 2017-07-30 DIAGNOSIS — I471 Supraventricular tachycardia: Secondary | ICD-10-CM | POA: Diagnosis not present

## 2017-07-30 DIAGNOSIS — I4892 Unspecified atrial flutter: Secondary | ICD-10-CM | POA: Diagnosis not present

## 2017-07-30 DIAGNOSIS — I4891 Unspecified atrial fibrillation: Secondary | ICD-10-CM | POA: Diagnosis not present

## 2017-08-11 DIAGNOSIS — Z6828 Body mass index (BMI) 28.0-28.9, adult: Secondary | ICD-10-CM | POA: Diagnosis not present

## 2017-08-11 DIAGNOSIS — Z23 Encounter for immunization: Secondary | ICD-10-CM | POA: Diagnosis not present

## 2017-08-11 DIAGNOSIS — S41112A Laceration without foreign body of left upper arm, initial encounter: Secondary | ICD-10-CM | POA: Diagnosis not present

## 2017-08-26 DIAGNOSIS — G301 Alzheimer's disease with late onset: Secondary | ICD-10-CM | POA: Diagnosis not present

## 2017-08-26 DIAGNOSIS — F0280 Dementia in other diseases classified elsewhere without behavioral disturbance: Secondary | ICD-10-CM | POA: Diagnosis not present

## 2017-08-26 DIAGNOSIS — F329 Major depressive disorder, single episode, unspecified: Secondary | ICD-10-CM | POA: Diagnosis not present

## 2017-08-26 DIAGNOSIS — F411 Generalized anxiety disorder: Secondary | ICD-10-CM | POA: Diagnosis not present

## 2017-12-04 DIAGNOSIS — E559 Vitamin D deficiency, unspecified: Secondary | ICD-10-CM | POA: Diagnosis not present

## 2017-12-04 DIAGNOSIS — Z9181 History of falling: Secondary | ICD-10-CM | POA: Diagnosis not present

## 2017-12-04 DIAGNOSIS — F418 Other specified anxiety disorders: Secondary | ICD-10-CM | POA: Diagnosis not present

## 2017-12-04 DIAGNOSIS — Z1339 Encounter for screening examination for other mental health and behavioral disorders: Secondary | ICD-10-CM | POA: Diagnosis not present

## 2017-12-04 DIAGNOSIS — Z79899 Other long term (current) drug therapy: Secondary | ICD-10-CM | POA: Diagnosis not present

## 2017-12-04 DIAGNOSIS — E7800 Pure hypercholesterolemia, unspecified: Secondary | ICD-10-CM | POA: Diagnosis not present

## 2017-12-04 DIAGNOSIS — I10 Essential (primary) hypertension: Secondary | ICD-10-CM | POA: Diagnosis not present

## 2017-12-04 DIAGNOSIS — K589 Irritable bowel syndrome without diarrhea: Secondary | ICD-10-CM | POA: Diagnosis not present

## 2017-12-09 DIAGNOSIS — I480 Paroxysmal atrial fibrillation: Secondary | ICD-10-CM | POA: Diagnosis not present

## 2017-12-09 DIAGNOSIS — I4891 Unspecified atrial fibrillation: Secondary | ICD-10-CM | POA: Diagnosis not present

## 2017-12-09 DIAGNOSIS — I10 Essential (primary) hypertension: Secondary | ICD-10-CM | POA: Diagnosis not present

## 2017-12-09 DIAGNOSIS — I4892 Unspecified atrial flutter: Secondary | ICD-10-CM | POA: Diagnosis not present

## 2017-12-23 DIAGNOSIS — J069 Acute upper respiratory infection, unspecified: Secondary | ICD-10-CM | POA: Diagnosis not present

## 2017-12-23 DIAGNOSIS — I4891 Unspecified atrial fibrillation: Secondary | ICD-10-CM | POA: Diagnosis not present

## 2018-01-14 DIAGNOSIS — Z950 Presence of cardiac pacemaker: Secondary | ICD-10-CM | POA: Diagnosis not present

## 2018-01-14 DIAGNOSIS — I495 Sick sinus syndrome: Secondary | ICD-10-CM | POA: Diagnosis not present

## 2018-01-14 DIAGNOSIS — Z45018 Encounter for adjustment and management of other part of cardiac pacemaker: Secondary | ICD-10-CM | POA: Diagnosis not present

## 2018-02-02 DIAGNOSIS — G4733 Obstructive sleep apnea (adult) (pediatric): Secondary | ICD-10-CM | POA: Diagnosis not present

## 2018-02-02 DIAGNOSIS — R0602 Shortness of breath: Secondary | ICD-10-CM | POA: Diagnosis not present

## 2018-02-03 DIAGNOSIS — G4733 Obstructive sleep apnea (adult) (pediatric): Secondary | ICD-10-CM | POA: Diagnosis not present

## 2018-02-03 DIAGNOSIS — R0602 Shortness of breath: Secondary | ICD-10-CM | POA: Diagnosis not present

## 2018-02-23 DIAGNOSIS — G301 Alzheimer's disease with late onset: Secondary | ICD-10-CM | POA: Diagnosis not present

## 2018-02-23 DIAGNOSIS — F0280 Dementia in other diseases classified elsewhere without behavioral disturbance: Secondary | ICD-10-CM | POA: Diagnosis not present

## 2018-02-23 DIAGNOSIS — G4730 Sleep apnea, unspecified: Secondary | ICD-10-CM | POA: Diagnosis not present

## 2018-02-23 DIAGNOSIS — F411 Generalized anxiety disorder: Secondary | ICD-10-CM | POA: Diagnosis not present

## 2018-02-23 DIAGNOSIS — G4719 Other hypersomnia: Secondary | ICD-10-CM | POA: Diagnosis not present

## 2018-03-31 DIAGNOSIS — Z6827 Body mass index (BMI) 27.0-27.9, adult: Secondary | ICD-10-CM | POA: Diagnosis not present

## 2018-03-31 DIAGNOSIS — J189 Pneumonia, unspecified organism: Secondary | ICD-10-CM | POA: Diagnosis not present

## 2018-04-06 DIAGNOSIS — R918 Other nonspecific abnormal finding of lung field: Secondary | ICD-10-CM | POA: Diagnosis not present

## 2018-04-06 DIAGNOSIS — J208 Acute bronchitis due to other specified organisms: Secondary | ICD-10-CM | POA: Diagnosis not present

## 2018-04-06 DIAGNOSIS — J441 Chronic obstructive pulmonary disease with (acute) exacerbation: Secondary | ICD-10-CM | POA: Diagnosis not present

## 2018-04-06 DIAGNOSIS — J440 Chronic obstructive pulmonary disease with acute lower respiratory infection: Secondary | ICD-10-CM | POA: Diagnosis not present

## 2018-04-06 DIAGNOSIS — J209 Acute bronchitis, unspecified: Secondary | ICD-10-CM | POA: Diagnosis not present

## 2018-04-07 DIAGNOSIS — R7989 Other specified abnormal findings of blood chemistry: Secondary | ICD-10-CM | POA: Diagnosis not present

## 2018-04-07 DIAGNOSIS — F0280 Dementia in other diseases classified elsewhere without behavioral disturbance: Secondary | ICD-10-CM | POA: Diagnosis not present

## 2018-04-07 DIAGNOSIS — I517 Cardiomegaly: Secondary | ICD-10-CM | POA: Diagnosis not present

## 2018-04-07 DIAGNOSIS — R05 Cough: Secondary | ICD-10-CM | POA: Diagnosis not present

## 2018-04-07 DIAGNOSIS — R079 Chest pain, unspecified: Secondary | ICD-10-CM | POA: Diagnosis not present

## 2018-04-07 DIAGNOSIS — R0789 Other chest pain: Secondary | ICD-10-CM | POA: Diagnosis not present

## 2018-04-07 DIAGNOSIS — R918 Other nonspecific abnormal finding of lung field: Secondary | ICD-10-CM | POA: Diagnosis not present

## 2018-04-07 DIAGNOSIS — Z87891 Personal history of nicotine dependence: Secondary | ICD-10-CM | POA: Diagnosis not present

## 2018-04-07 DIAGNOSIS — Z79899 Other long term (current) drug therapy: Secondary | ICD-10-CM | POA: Diagnosis not present

## 2018-04-07 DIAGNOSIS — I480 Paroxysmal atrial fibrillation: Secondary | ICD-10-CM | POA: Diagnosis not present

## 2018-04-07 DIAGNOSIS — R0602 Shortness of breath: Secondary | ICD-10-CM | POA: Diagnosis not present

## 2018-04-07 DIAGNOSIS — R062 Wheezing: Secondary | ICD-10-CM | POA: Diagnosis not present

## 2018-04-07 DIAGNOSIS — G301 Alzheimer's disease with late onset: Secondary | ICD-10-CM | POA: Diagnosis not present

## 2018-04-07 DIAGNOSIS — Z950 Presence of cardiac pacemaker: Secondary | ICD-10-CM | POA: Diagnosis not present

## 2018-04-07 DIAGNOSIS — I10 Essential (primary) hypertension: Secondary | ICD-10-CM | POA: Diagnosis not present

## 2018-04-07 DIAGNOSIS — R9431 Abnormal electrocardiogram [ECG] [EKG]: Secondary | ICD-10-CM | POA: Diagnosis not present

## 2018-04-07 DIAGNOSIS — R5383 Other fatigue: Secondary | ICD-10-CM | POA: Diagnosis not present

## 2018-04-07 DIAGNOSIS — Z5181 Encounter for therapeutic drug level monitoring: Secondary | ICD-10-CM | POA: Diagnosis not present

## 2018-04-07 DIAGNOSIS — J9811 Atelectasis: Secondary | ICD-10-CM | POA: Diagnosis not present

## 2018-04-07 DIAGNOSIS — R5381 Other malaise: Secondary | ICD-10-CM | POA: Diagnosis not present

## 2018-04-08 DIAGNOSIS — R0602 Shortness of breath: Secondary | ICD-10-CM | POA: Diagnosis not present

## 2018-04-08 DIAGNOSIS — I480 Paroxysmal atrial fibrillation: Secondary | ICD-10-CM | POA: Diagnosis not present

## 2018-04-08 DIAGNOSIS — F0280 Dementia in other diseases classified elsewhere without behavioral disturbance: Secondary | ICD-10-CM | POA: Diagnosis not present

## 2018-04-08 DIAGNOSIS — G301 Alzheimer's disease with late onset: Secondary | ICD-10-CM | POA: Diagnosis not present

## 2018-04-08 DIAGNOSIS — R079 Chest pain, unspecified: Secondary | ICD-10-CM | POA: Diagnosis not present

## 2018-04-08 DIAGNOSIS — Z950 Presence of cardiac pacemaker: Secondary | ICD-10-CM | POA: Diagnosis not present

## 2018-04-08 DIAGNOSIS — I10 Essential (primary) hypertension: Secondary | ICD-10-CM | POA: Diagnosis not present

## 2018-04-08 DIAGNOSIS — R0789 Other chest pain: Secondary | ICD-10-CM | POA: Diagnosis not present

## 2018-04-08 DIAGNOSIS — R7989 Other specified abnormal findings of blood chemistry: Secondary | ICD-10-CM | POA: Diagnosis not present

## 2018-04-16 DIAGNOSIS — I480 Paroxysmal atrial fibrillation: Secondary | ICD-10-CM | POA: Diagnosis not present

## 2018-04-16 DIAGNOSIS — Z950 Presence of cardiac pacemaker: Secondary | ICD-10-CM | POA: Diagnosis not present

## 2018-04-16 DIAGNOSIS — R05 Cough: Secondary | ICD-10-CM | POA: Diagnosis not present

## 2018-04-16 DIAGNOSIS — Z09 Encounter for follow-up examination after completed treatment for conditions other than malignant neoplasm: Secondary | ICD-10-CM | POA: Diagnosis not present

## 2018-04-16 DIAGNOSIS — R079 Chest pain, unspecified: Secondary | ICD-10-CM | POA: Diagnosis not present

## 2018-04-16 DIAGNOSIS — Z87891 Personal history of nicotine dependence: Secondary | ICD-10-CM | POA: Diagnosis not present

## 2018-04-16 DIAGNOSIS — I471 Supraventricular tachycardia: Secondary | ICD-10-CM | POA: Diagnosis not present

## 2018-04-22 DIAGNOSIS — I480 Paroxysmal atrial fibrillation: Secondary | ICD-10-CM | POA: Diagnosis not present

## 2018-04-22 DIAGNOSIS — G308 Other Alzheimer's disease: Secondary | ICD-10-CM | POA: Diagnosis not present

## 2018-04-22 DIAGNOSIS — Z45018 Encounter for adjustment and management of other part of cardiac pacemaker: Secondary | ICD-10-CM | POA: Diagnosis not present

## 2018-04-22 DIAGNOSIS — I495 Sick sinus syndrome: Secondary | ICD-10-CM | POA: Diagnosis not present

## 2018-04-22 DIAGNOSIS — F0280 Dementia in other diseases classified elsewhere without behavioral disturbance: Secondary | ICD-10-CM | POA: Diagnosis not present

## 2018-04-22 DIAGNOSIS — I10 Essential (primary) hypertension: Secondary | ICD-10-CM | POA: Diagnosis not present

## 2018-04-22 DIAGNOSIS — Z950 Presence of cardiac pacemaker: Secondary | ICD-10-CM | POA: Diagnosis not present

## 2018-05-21 DIAGNOSIS — H35363 Drusen (degenerative) of macula, bilateral: Secondary | ICD-10-CM | POA: Diagnosis not present

## 2018-06-26 DIAGNOSIS — E7800 Pure hypercholesterolemia, unspecified: Secondary | ICD-10-CM | POA: Diagnosis not present

## 2018-06-26 DIAGNOSIS — F418 Other specified anxiety disorders: Secondary | ICD-10-CM | POA: Diagnosis not present

## 2018-06-26 DIAGNOSIS — I10 Essential (primary) hypertension: Secondary | ICD-10-CM | POA: Diagnosis not present

## 2018-06-26 DIAGNOSIS — J449 Chronic obstructive pulmonary disease, unspecified: Secondary | ICD-10-CM | POA: Diagnosis not present

## 2018-08-06 DIAGNOSIS — G301 Alzheimer's disease with late onset: Secondary | ICD-10-CM | POA: Diagnosis not present

## 2018-08-06 DIAGNOSIS — F0280 Dementia in other diseases classified elsewhere without behavioral disturbance: Secondary | ICD-10-CM | POA: Diagnosis not present

## 2018-09-10 DIAGNOSIS — Z950 Presence of cardiac pacemaker: Secondary | ICD-10-CM | POA: Diagnosis not present

## 2018-09-10 DIAGNOSIS — I495 Sick sinus syndrome: Secondary | ICD-10-CM | POA: Diagnosis not present

## 2018-09-10 DIAGNOSIS — Z45018 Encounter for adjustment and management of other part of cardiac pacemaker: Secondary | ICD-10-CM | POA: Diagnosis not present

## 2018-09-28 DIAGNOSIS — E559 Vitamin D deficiency, unspecified: Secondary | ICD-10-CM | POA: Diagnosis not present

## 2018-09-28 DIAGNOSIS — Z139 Encounter for screening, unspecified: Secondary | ICD-10-CM | POA: Diagnosis not present

## 2018-09-28 DIAGNOSIS — Z79899 Other long term (current) drug therapy: Secondary | ICD-10-CM | POA: Diagnosis not present

## 2018-09-28 DIAGNOSIS — I10 Essential (primary) hypertension: Secondary | ICD-10-CM | POA: Diagnosis not present

## 2018-09-28 DIAGNOSIS — K589 Irritable bowel syndrome without diarrhea: Secondary | ICD-10-CM | POA: Diagnosis not present

## 2018-09-28 DIAGNOSIS — E7800 Pure hypercholesterolemia, unspecified: Secondary | ICD-10-CM | POA: Diagnosis not present

## 2018-09-28 DIAGNOSIS — F418 Other specified anxiety disorders: Secondary | ICD-10-CM | POA: Diagnosis not present

## 2018-10-29 DIAGNOSIS — I480 Paroxysmal atrial fibrillation: Secondary | ICD-10-CM | POA: Diagnosis not present

## 2018-10-29 DIAGNOSIS — I495 Sick sinus syndrome: Secondary | ICD-10-CM | POA: Diagnosis not present

## 2018-10-29 DIAGNOSIS — I10 Essential (primary) hypertension: Secondary | ICD-10-CM | POA: Diagnosis not present

## 2018-10-29 DIAGNOSIS — E785 Hyperlipidemia, unspecified: Secondary | ICD-10-CM | POA: Diagnosis not present

## 2018-10-29 DIAGNOSIS — G4730 Sleep apnea, unspecified: Secondary | ICD-10-CM | POA: Diagnosis not present

## 2018-12-03 DIAGNOSIS — J441 Chronic obstructive pulmonary disease with (acute) exacerbation: Secondary | ICD-10-CM | POA: Diagnosis not present

## 2018-12-14 DIAGNOSIS — J441 Chronic obstructive pulmonary disease with (acute) exacerbation: Secondary | ICD-10-CM | POA: Diagnosis not present

## 2018-12-14 DIAGNOSIS — Z1159 Encounter for screening for other viral diseases: Secondary | ICD-10-CM | POA: Diagnosis not present

## 2018-12-22 DIAGNOSIS — Z950 Presence of cardiac pacemaker: Secondary | ICD-10-CM | POA: Diagnosis not present

## 2018-12-22 DIAGNOSIS — Z45018 Encounter for adjustment and management of other part of cardiac pacemaker: Secondary | ICD-10-CM | POA: Diagnosis not present

## 2018-12-22 DIAGNOSIS — I495 Sick sinus syndrome: Secondary | ICD-10-CM | POA: Diagnosis not present

## 2019-01-11 DIAGNOSIS — Z20828 Contact with and (suspected) exposure to other viral communicable diseases: Secondary | ICD-10-CM | POA: Diagnosis not present

## 2019-02-15 DIAGNOSIS — Z1331 Encounter for screening for depression: Secondary | ICD-10-CM | POA: Diagnosis not present

## 2019-02-15 DIAGNOSIS — E785 Hyperlipidemia, unspecified: Secondary | ICD-10-CM | POA: Diagnosis not present

## 2019-02-15 DIAGNOSIS — Z0000 Encounter for general adult medical examination without abnormal findings: Secondary | ICD-10-CM | POA: Diagnosis not present

## 2019-02-15 DIAGNOSIS — Z9181 History of falling: Secondary | ICD-10-CM | POA: Diagnosis not present

## 2020-02-17 DIAGNOSIS — G301 Alzheimer's disease with late onset: Secondary | ICD-10-CM | POA: Diagnosis not present

## 2020-02-17 DIAGNOSIS — F0280 Dementia in other diseases classified elsewhere without behavioral disturbance: Secondary | ICD-10-CM | POA: Diagnosis not present

## 2020-04-20 DIAGNOSIS — Z79899 Other long term (current) drug therapy: Secondary | ICD-10-CM | POA: Diagnosis not present

## 2020-04-20 DIAGNOSIS — R634 Abnormal weight loss: Secondary | ICD-10-CM | POA: Diagnosis not present

## 2020-04-20 DIAGNOSIS — E7800 Pure hypercholesterolemia, unspecified: Secondary | ICD-10-CM | POA: Diagnosis not present

## 2020-04-20 DIAGNOSIS — F418 Other specified anxiety disorders: Secondary | ICD-10-CM | POA: Diagnosis not present

## 2020-04-20 DIAGNOSIS — J449 Chronic obstructive pulmonary disease, unspecified: Secondary | ICD-10-CM | POA: Diagnosis not present

## 2020-04-20 DIAGNOSIS — G3184 Mild cognitive impairment, so stated: Secondary | ICD-10-CM | POA: Diagnosis not present

## 2020-04-20 DIAGNOSIS — I4891 Unspecified atrial fibrillation: Secondary | ICD-10-CM | POA: Diagnosis not present

## 2020-04-20 DIAGNOSIS — I10 Essential (primary) hypertension: Secondary | ICD-10-CM | POA: Diagnosis not present

## 2020-04-20 DIAGNOSIS — K589 Irritable bowel syndrome without diarrhea: Secondary | ICD-10-CM | POA: Diagnosis not present

## 2020-04-20 DIAGNOSIS — E559 Vitamin D deficiency, unspecified: Secondary | ICD-10-CM | POA: Diagnosis not present

## 2020-04-20 DIAGNOSIS — Z6823 Body mass index (BMI) 23.0-23.9, adult: Secondary | ICD-10-CM | POA: Diagnosis not present

## 2020-04-20 DIAGNOSIS — H6122 Impacted cerumen, left ear: Secondary | ICD-10-CM | POA: Diagnosis not present

## 2020-04-24 DIAGNOSIS — I10 Essential (primary) hypertension: Secondary | ICD-10-CM | POA: Diagnosis not present

## 2020-04-24 DIAGNOSIS — I495 Sick sinus syndrome: Secondary | ICD-10-CM | POA: Diagnosis not present

## 2020-04-24 DIAGNOSIS — I480 Paroxysmal atrial fibrillation: Secondary | ICD-10-CM | POA: Diagnosis not present

## 2020-04-24 DIAGNOSIS — E785 Hyperlipidemia, unspecified: Secondary | ICD-10-CM | POA: Diagnosis not present

## 2020-04-25 DIAGNOSIS — R0602 Shortness of breath: Secondary | ICD-10-CM | POA: Diagnosis not present

## 2020-04-25 DIAGNOSIS — J4531 Mild persistent asthma with (acute) exacerbation: Secondary | ICD-10-CM | POA: Diagnosis not present

## 2020-06-14 DIAGNOSIS — F0280 Dementia in other diseases classified elsewhere without behavioral disturbance: Secondary | ICD-10-CM | POA: Diagnosis not present

## 2020-06-14 DIAGNOSIS — R634 Abnormal weight loss: Secondary | ICD-10-CM | POA: Diagnosis not present

## 2020-06-14 DIAGNOSIS — Z6823 Body mass index (BMI) 23.0-23.9, adult: Secondary | ICD-10-CM | POA: Diagnosis not present

## 2020-06-14 DIAGNOSIS — G309 Alzheimer's disease, unspecified: Secondary | ICD-10-CM | POA: Diagnosis not present

## 2020-06-14 DIAGNOSIS — N390 Urinary tract infection, site not specified: Secondary | ICD-10-CM | POA: Diagnosis not present

## 2020-06-14 DIAGNOSIS — R34 Anuria and oliguria: Secondary | ICD-10-CM | POA: Diagnosis not present

## 2020-06-27 DIAGNOSIS — Z79899 Other long term (current) drug therapy: Secondary | ICD-10-CM | POA: Diagnosis not present

## 2020-06-27 DIAGNOSIS — R0902 Hypoxemia: Secondary | ICD-10-CM | POA: Diagnosis not present

## 2020-06-27 DIAGNOSIS — Z20822 Contact with and (suspected) exposure to covid-19: Secondary | ICD-10-CM | POA: Diagnosis not present

## 2020-06-27 DIAGNOSIS — E1165 Type 2 diabetes mellitus with hyperglycemia: Secondary | ICD-10-CM | POA: Diagnosis not present

## 2020-06-27 DIAGNOSIS — J9691 Respiratory failure, unspecified with hypoxia: Secondary | ICD-10-CM | POA: Diagnosis not present

## 2020-06-27 DIAGNOSIS — R58 Hemorrhage, not elsewhere classified: Secondary | ICD-10-CM | POA: Diagnosis not present

## 2020-06-27 DIAGNOSIS — G309 Alzheimer's disease, unspecified: Secondary | ICD-10-CM | POA: Diagnosis not present

## 2020-06-27 DIAGNOSIS — F0280 Dementia in other diseases classified elsewhere without behavioral disturbance: Secondary | ICD-10-CM | POA: Diagnosis not present

## 2020-06-27 DIAGNOSIS — Z888 Allergy status to other drugs, medicaments and biological substances status: Secondary | ICD-10-CM | POA: Diagnosis not present

## 2020-06-27 DIAGNOSIS — Z7982 Long term (current) use of aspirin: Secondary | ICD-10-CM | POA: Diagnosis not present

## 2020-06-27 DIAGNOSIS — K922 Gastrointestinal hemorrhage, unspecified: Secondary | ICD-10-CM | POA: Diagnosis not present

## 2020-06-27 DIAGNOSIS — Z8719 Personal history of other diseases of the digestive system: Secondary | ICD-10-CM | POA: Diagnosis not present

## 2020-06-27 DIAGNOSIS — R0689 Other abnormalities of breathing: Secondary | ICD-10-CM | POA: Diagnosis not present

## 2020-06-27 DIAGNOSIS — E876 Hypokalemia: Secondary | ICD-10-CM | POA: Diagnosis not present

## 2020-06-27 DIAGNOSIS — Z4682 Encounter for fitting and adjustment of non-vascular catheter: Secondary | ICD-10-CM | POA: Diagnosis not present

## 2020-06-27 DIAGNOSIS — Z950 Presence of cardiac pacemaker: Secondary | ICD-10-CM | POA: Diagnosis not present

## 2020-06-27 DIAGNOSIS — I10 Essential (primary) hypertension: Secondary | ICD-10-CM | POA: Diagnosis not present

## 2020-06-27 DIAGNOSIS — I495 Sick sinus syndrome: Secondary | ICD-10-CM | POA: Diagnosis not present

## 2020-06-27 DIAGNOSIS — R404 Transient alteration of awareness: Secondary | ICD-10-CM | POA: Diagnosis not present

## 2020-06-27 DIAGNOSIS — J9690 Respiratory failure, unspecified, unspecified whether with hypoxia or hypercapnia: Secondary | ICD-10-CM | POA: Diagnosis not present

## 2020-06-27 DIAGNOSIS — K920 Hematemesis: Secondary | ICD-10-CM | POA: Diagnosis not present

## 2020-06-27 DIAGNOSIS — Z9911 Dependence on respirator [ventilator] status: Secondary | ICD-10-CM | POA: Diagnosis not present

## 2020-06-27 DIAGNOSIS — R918 Other nonspecific abnormal finding of lung field: Secondary | ICD-10-CM | POA: Diagnosis not present

## 2020-06-27 DIAGNOSIS — R400 Somnolence: Secondary | ICD-10-CM | POA: Diagnosis not present

## 2020-06-28 DIAGNOSIS — R6520 Severe sepsis without septic shock: Secondary | ICD-10-CM | POA: Diagnosis present

## 2020-06-28 DIAGNOSIS — I129 Hypertensive chronic kidney disease with stage 1 through stage 4 chronic kidney disease, or unspecified chronic kidney disease: Secondary | ICD-10-CM | POA: Diagnosis present

## 2020-06-28 DIAGNOSIS — A419 Sepsis, unspecified organism: Secondary | ICD-10-CM | POA: Diagnosis present

## 2020-06-28 DIAGNOSIS — K922 Gastrointestinal hemorrhage, unspecified: Secondary | ICD-10-CM | POA: Diagnosis not present

## 2020-06-28 DIAGNOSIS — I499 Cardiac arrhythmia, unspecified: Secondary | ICD-10-CM | POA: Diagnosis not present

## 2020-06-28 DIAGNOSIS — E872 Acidosis: Secondary | ICD-10-CM | POA: Diagnosis present

## 2020-06-28 DIAGNOSIS — I472 Ventricular tachycardia: Secondary | ICD-10-CM | POA: Diagnosis not present

## 2020-06-28 DIAGNOSIS — I455 Other specified heart block: Secondary | ICD-10-CM | POA: Diagnosis not present

## 2020-06-28 DIAGNOSIS — Z28310 Unvaccinated for covid-19: Secondary | ICD-10-CM | POA: Diagnosis not present

## 2020-06-28 DIAGNOSIS — N189 Chronic kidney disease, unspecified: Secondary | ICD-10-CM | POA: Diagnosis present

## 2020-06-28 DIAGNOSIS — Z888 Allergy status to other drugs, medicaments and biological substances status: Secondary | ICD-10-CM | POA: Diagnosis not present

## 2020-06-28 DIAGNOSIS — R54 Age-related physical debility: Secondary | ICD-10-CM | POA: Diagnosis present

## 2020-06-28 DIAGNOSIS — I4819 Other persistent atrial fibrillation: Secondary | ICD-10-CM | POA: Diagnosis present

## 2020-06-28 DIAGNOSIS — I083 Combined rheumatic disorders of mitral, aortic and tricuspid valves: Secondary | ICD-10-CM | POA: Diagnosis not present

## 2020-06-28 DIAGNOSIS — S0990XA Unspecified injury of head, initial encounter: Secondary | ICD-10-CM | POA: Diagnosis not present

## 2020-06-28 DIAGNOSIS — Z9981 Dependence on supplemental oxygen: Secondary | ICD-10-CM | POA: Diagnosis not present

## 2020-06-28 DIAGNOSIS — F0280 Dementia in other diseases classified elsewhere without behavioral disturbance: Secondary | ICD-10-CM | POA: Diagnosis present

## 2020-06-28 DIAGNOSIS — E876 Hypokalemia: Secondary | ICD-10-CM | POA: Diagnosis present

## 2020-06-28 DIAGNOSIS — J439 Emphysema, unspecified: Secondary | ICD-10-CM | POA: Diagnosis present

## 2020-06-28 DIAGNOSIS — I480 Paroxysmal atrial fibrillation: Secondary | ICD-10-CM | POA: Diagnosis not present

## 2020-06-28 DIAGNOSIS — J9601 Acute respiratory failure with hypoxia: Secondary | ICD-10-CM | POA: Diagnosis present

## 2020-06-28 DIAGNOSIS — Z950 Presence of cardiac pacemaker: Secondary | ICD-10-CM | POA: Diagnosis not present

## 2020-06-28 DIAGNOSIS — G309 Alzheimer's disease, unspecified: Secondary | ICD-10-CM | POA: Diagnosis present

## 2020-06-28 DIAGNOSIS — Z45018 Encounter for adjustment and management of other part of cardiac pacemaker: Secondary | ICD-10-CM | POA: Diagnosis not present

## 2020-06-28 DIAGNOSIS — J690 Pneumonitis due to inhalation of food and vomit: Secondary | ICD-10-CM | POA: Diagnosis present

## 2020-06-28 DIAGNOSIS — K920 Hematemesis: Secondary | ICD-10-CM | POA: Diagnosis present

## 2020-06-28 DIAGNOSIS — S0993XA Unspecified injury of face, initial encounter: Secondary | ICD-10-CM | POA: Diagnosis not present

## 2020-06-28 DIAGNOSIS — Z7982 Long term (current) use of aspirin: Secondary | ICD-10-CM | POA: Diagnosis not present

## 2020-06-28 DIAGNOSIS — R0600 Dyspnea, unspecified: Secondary | ICD-10-CM | POA: Diagnosis not present

## 2020-06-28 DIAGNOSIS — D649 Anemia, unspecified: Secondary | ICD-10-CM | POA: Diagnosis present

## 2020-06-28 DIAGNOSIS — R918 Other nonspecific abnormal finding of lung field: Secondary | ICD-10-CM | POA: Diagnosis not present

## 2020-06-28 DIAGNOSIS — Z4682 Encounter for fitting and adjustment of non-vascular catheter: Secondary | ICD-10-CM | POA: Diagnosis not present

## 2020-06-28 DIAGNOSIS — I495 Sick sinus syndrome: Secondary | ICD-10-CM | POA: Diagnosis present

## 2020-07-04 DIAGNOSIS — I10 Essential (primary) hypertension: Secondary | ICD-10-CM | POA: Diagnosis not present

## 2020-07-04 DIAGNOSIS — F0280 Dementia in other diseases classified elsewhere without behavioral disturbance: Secondary | ICD-10-CM | POA: Diagnosis not present

## 2020-07-04 DIAGNOSIS — Z7951 Long term (current) use of inhaled steroids: Secondary | ICD-10-CM | POA: Diagnosis not present

## 2020-07-04 DIAGNOSIS — J45909 Unspecified asthma, uncomplicated: Secondary | ICD-10-CM | POA: Diagnosis not present

## 2020-07-04 DIAGNOSIS — Z950 Presence of cardiac pacemaker: Secondary | ICD-10-CM | POA: Diagnosis not present

## 2020-07-04 DIAGNOSIS — G309 Alzheimer's disease, unspecified: Secondary | ICD-10-CM | POA: Diagnosis not present

## 2020-07-04 DIAGNOSIS — J690 Pneumonitis due to inhalation of food and vomit: Secondary | ICD-10-CM | POA: Diagnosis not present

## 2020-07-04 DIAGNOSIS — I495 Sick sinus syndrome: Secondary | ICD-10-CM | POA: Diagnosis not present

## 2020-07-04 DIAGNOSIS — I480 Paroxysmal atrial fibrillation: Secondary | ICD-10-CM | POA: Diagnosis not present

## 2020-07-04 DIAGNOSIS — Z9181 History of falling: Secondary | ICD-10-CM | POA: Diagnosis not present

## 2020-07-04 DIAGNOSIS — I471 Supraventricular tachycardia: Secondary | ICD-10-CM | POA: Diagnosis not present

## 2020-07-04 DIAGNOSIS — J439 Emphysema, unspecified: Secondary | ICD-10-CM | POA: Diagnosis not present

## 2020-07-04 DIAGNOSIS — Z7982 Long term (current) use of aspirin: Secondary | ICD-10-CM | POA: Diagnosis not present

## 2020-07-06 DIAGNOSIS — J45909 Unspecified asthma, uncomplicated: Secondary | ICD-10-CM | POA: Diagnosis not present

## 2020-07-06 DIAGNOSIS — G309 Alzheimer's disease, unspecified: Secondary | ICD-10-CM | POA: Diagnosis not present

## 2020-07-06 DIAGNOSIS — J690 Pneumonitis due to inhalation of food and vomit: Secondary | ICD-10-CM | POA: Diagnosis not present

## 2020-07-06 DIAGNOSIS — I10 Essential (primary) hypertension: Secondary | ICD-10-CM | POA: Diagnosis not present

## 2020-07-06 DIAGNOSIS — J439 Emphysema, unspecified: Secondary | ICD-10-CM | POA: Diagnosis not present

## 2020-07-06 DIAGNOSIS — F0280 Dementia in other diseases classified elsewhere without behavioral disturbance: Secondary | ICD-10-CM | POA: Diagnosis not present

## 2020-07-08 DIAGNOSIS — K551 Chronic vascular disorders of intestine: Secondary | ICD-10-CM | POA: Diagnosis not present

## 2020-07-08 DIAGNOSIS — R1013 Epigastric pain: Secondary | ICD-10-CM | POA: Diagnosis not present

## 2020-07-08 DIAGNOSIS — Z8701 Personal history of pneumonia (recurrent): Secondary | ICD-10-CM | POA: Diagnosis not present

## 2020-07-08 DIAGNOSIS — R109 Unspecified abdominal pain: Secondary | ICD-10-CM | POA: Diagnosis not present

## 2020-07-08 DIAGNOSIS — R051 Acute cough: Secondary | ICD-10-CM | POA: Diagnosis not present

## 2020-07-08 DIAGNOSIS — E279 Disorder of adrenal gland, unspecified: Secondary | ICD-10-CM | POA: Diagnosis not present

## 2020-07-08 DIAGNOSIS — F0390 Unspecified dementia without behavioral disturbance: Secondary | ICD-10-CM | POA: Diagnosis not present

## 2020-07-08 DIAGNOSIS — R531 Weakness: Secondary | ICD-10-CM | POA: Diagnosis not present

## 2020-07-08 DIAGNOSIS — R079 Chest pain, unspecified: Secondary | ICD-10-CM | POA: Diagnosis not present

## 2020-07-08 DIAGNOSIS — F0280 Dementia in other diseases classified elsewhere without behavioral disturbance: Secondary | ICD-10-CM | POA: Diagnosis not present

## 2020-07-08 DIAGNOSIS — Z87891 Personal history of nicotine dependence: Secondary | ICD-10-CM | POA: Diagnosis not present

## 2020-07-08 DIAGNOSIS — G309 Alzheimer's disease, unspecified: Secondary | ICD-10-CM | POA: Diagnosis not present

## 2020-07-08 DIAGNOSIS — D72829 Elevated white blood cell count, unspecified: Secondary | ICD-10-CM | POA: Diagnosis not present

## 2020-07-08 DIAGNOSIS — Z8719 Personal history of other diseases of the digestive system: Secondary | ICD-10-CM | POA: Diagnosis not present

## 2020-07-09 DIAGNOSIS — E279 Disorder of adrenal gland, unspecified: Secondary | ICD-10-CM | POA: Diagnosis not present

## 2020-07-09 DIAGNOSIS — R1013 Epigastric pain: Secondary | ICD-10-CM | POA: Diagnosis not present

## 2020-07-11 DIAGNOSIS — J449 Chronic obstructive pulmonary disease, unspecified: Secondary | ICD-10-CM | POA: Diagnosis not present

## 2020-07-11 DIAGNOSIS — G309 Alzheimer's disease, unspecified: Secondary | ICD-10-CM | POA: Diagnosis not present

## 2020-07-11 DIAGNOSIS — D62 Acute posthemorrhagic anemia: Secondary | ICD-10-CM | POA: Diagnosis not present

## 2020-07-11 DIAGNOSIS — J9601 Acute respiratory failure with hypoxia: Secondary | ICD-10-CM | POA: Diagnosis not present

## 2020-07-11 DIAGNOSIS — I10 Essential (primary) hypertension: Secondary | ICD-10-CM | POA: Diagnosis not present

## 2020-07-11 DIAGNOSIS — Z6823 Body mass index (BMI) 23.0-23.9, adult: Secondary | ICD-10-CM | POA: Diagnosis not present

## 2020-07-11 DIAGNOSIS — Z79899 Other long term (current) drug therapy: Secondary | ICD-10-CM | POA: Diagnosis not present

## 2020-07-11 DIAGNOSIS — J9602 Acute respiratory failure with hypercapnia: Secondary | ICD-10-CM | POA: Diagnosis not present

## 2020-07-11 DIAGNOSIS — F0280 Dementia in other diseases classified elsewhere without behavioral disturbance: Secondary | ICD-10-CM | POA: Diagnosis not present

## 2020-07-11 DIAGNOSIS — I4891 Unspecified atrial fibrillation: Secondary | ICD-10-CM | POA: Diagnosis not present

## 2020-07-11 DIAGNOSIS — K920 Hematemesis: Secondary | ICD-10-CM | POA: Diagnosis not present

## 2020-07-13 DIAGNOSIS — E7800 Pure hypercholesterolemia, unspecified: Secondary | ICD-10-CM | POA: Diagnosis not present

## 2020-07-13 DIAGNOSIS — I471 Supraventricular tachycardia: Secondary | ICD-10-CM | POA: Diagnosis not present

## 2020-07-13 DIAGNOSIS — I480 Paroxysmal atrial fibrillation: Secondary | ICD-10-CM | POA: Diagnosis not present

## 2020-07-13 DIAGNOSIS — I10 Essential (primary) hypertension: Secondary | ICD-10-CM | POA: Diagnosis not present

## 2020-07-13 DIAGNOSIS — K922 Gastrointestinal hemorrhage, unspecified: Secondary | ICD-10-CM | POA: Diagnosis not present

## 2020-07-13 DIAGNOSIS — Z950 Presence of cardiac pacemaker: Secondary | ICD-10-CM | POA: Diagnosis not present

## 2020-07-18 DIAGNOSIS — M9901 Segmental and somatic dysfunction of cervical region: Secondary | ICD-10-CM | POA: Diagnosis not present

## 2020-07-18 DIAGNOSIS — M62830 Muscle spasm of back: Secondary | ICD-10-CM | POA: Diagnosis not present

## 2020-07-18 DIAGNOSIS — M9903 Segmental and somatic dysfunction of lumbar region: Secondary | ICD-10-CM | POA: Diagnosis not present

## 2020-07-18 DIAGNOSIS — M9905 Segmental and somatic dysfunction of pelvic region: Secondary | ICD-10-CM | POA: Diagnosis not present

## 2020-07-18 DIAGNOSIS — M9902 Segmental and somatic dysfunction of thoracic region: Secondary | ICD-10-CM | POA: Diagnosis not present

## 2020-07-24 DIAGNOSIS — F0280 Dementia in other diseases classified elsewhere without behavioral disturbance: Secondary | ICD-10-CM | POA: Diagnosis not present

## 2020-07-24 DIAGNOSIS — G309 Alzheimer's disease, unspecified: Secondary | ICD-10-CM | POA: Diagnosis not present

## 2020-07-24 DIAGNOSIS — J690 Pneumonitis due to inhalation of food and vomit: Secondary | ICD-10-CM | POA: Diagnosis not present

## 2020-07-24 DIAGNOSIS — J45909 Unspecified asthma, uncomplicated: Secondary | ICD-10-CM | POA: Diagnosis not present

## 2020-07-24 DIAGNOSIS — J439 Emphysema, unspecified: Secondary | ICD-10-CM | POA: Diagnosis not present

## 2020-07-24 DIAGNOSIS — I10 Essential (primary) hypertension: Secondary | ICD-10-CM | POA: Diagnosis not present

## 2020-08-04 DIAGNOSIS — E278 Other specified disorders of adrenal gland: Secondary | ICD-10-CM | POA: Diagnosis not present

## 2020-08-04 DIAGNOSIS — K5289 Other specified noninfective gastroenteritis and colitis: Secondary | ICD-10-CM | POA: Diagnosis not present

## 2020-08-04 DIAGNOSIS — N281 Cyst of kidney, acquired: Secondary | ICD-10-CM | POA: Diagnosis not present

## 2020-08-04 DIAGNOSIS — N3289 Other specified disorders of bladder: Secondary | ICD-10-CM | POA: Diagnosis not present

## 2020-08-04 DIAGNOSIS — Z950 Presence of cardiac pacemaker: Secondary | ICD-10-CM | POA: Diagnosis not present

## 2020-08-04 DIAGNOSIS — K644 Residual hemorrhoidal skin tags: Secondary | ICD-10-CM | POA: Diagnosis not present

## 2020-08-04 DIAGNOSIS — K5130 Ulcerative (chronic) rectosigmoiditis without complications: Secondary | ICD-10-CM | POA: Diagnosis not present

## 2020-08-04 DIAGNOSIS — Z87891 Personal history of nicotine dependence: Secondary | ICD-10-CM | POA: Diagnosis not present

## 2020-08-04 DIAGNOSIS — I10 Essential (primary) hypertension: Secondary | ICD-10-CM | POA: Diagnosis not present

## 2020-08-08 DIAGNOSIS — Z950 Presence of cardiac pacemaker: Secondary | ICD-10-CM | POA: Diagnosis not present

## 2020-08-08 DIAGNOSIS — G4733 Obstructive sleep apnea (adult) (pediatric): Secondary | ICD-10-CM | POA: Diagnosis not present

## 2020-08-08 DIAGNOSIS — I495 Sick sinus syndrome: Secondary | ICD-10-CM | POA: Diagnosis not present

## 2020-08-08 DIAGNOSIS — I480 Paroxysmal atrial fibrillation: Secondary | ICD-10-CM | POA: Diagnosis not present

## 2020-08-08 DIAGNOSIS — R197 Diarrhea, unspecified: Secondary | ICD-10-CM | POA: Diagnosis not present

## 2020-08-08 DIAGNOSIS — F0280 Dementia in other diseases classified elsewhere without behavioral disturbance: Secondary | ICD-10-CM | POA: Diagnosis not present

## 2020-08-08 DIAGNOSIS — A09 Infectious gastroenteritis and colitis, unspecified: Secondary | ICD-10-CM | POA: Diagnosis not present

## 2020-08-08 DIAGNOSIS — G309 Alzheimer's disease, unspecified: Secondary | ICD-10-CM | POA: Diagnosis not present

## 2020-08-08 DIAGNOSIS — Z8719 Personal history of other diseases of the digestive system: Secondary | ICD-10-CM | POA: Diagnosis not present

## 2020-08-08 DIAGNOSIS — I10 Essential (primary) hypertension: Secondary | ICD-10-CM | POA: Diagnosis not present

## 2020-08-08 DIAGNOSIS — J439 Emphysema, unspecified: Secondary | ICD-10-CM | POA: Diagnosis not present

## 2020-08-10 DIAGNOSIS — J9601 Acute respiratory failure with hypoxia: Secondary | ICD-10-CM | POA: Diagnosis not present

## 2020-08-10 DIAGNOSIS — I10 Essential (primary) hypertension: Secondary | ICD-10-CM | POA: Diagnosis not present

## 2020-08-10 DIAGNOSIS — I4891 Unspecified atrial fibrillation: Secondary | ICD-10-CM | POA: Diagnosis not present

## 2020-08-10 DIAGNOSIS — G309 Alzheimer's disease, unspecified: Secondary | ICD-10-CM | POA: Diagnosis not present

## 2020-08-10 DIAGNOSIS — N289 Disorder of kidney and ureter, unspecified: Secondary | ICD-10-CM | POA: Diagnosis not present

## 2020-08-10 DIAGNOSIS — J9602 Acute respiratory failure with hypercapnia: Secondary | ICD-10-CM | POA: Diagnosis not present

## 2020-08-10 DIAGNOSIS — F0280 Dementia in other diseases classified elsewhere without behavioral disturbance: Secondary | ICD-10-CM | POA: Diagnosis not present

## 2020-08-10 DIAGNOSIS — J449 Chronic obstructive pulmonary disease, unspecified: Secondary | ICD-10-CM | POA: Diagnosis not present

## 2020-08-10 DIAGNOSIS — D62 Acute posthemorrhagic anemia: Secondary | ICD-10-CM | POA: Diagnosis not present

## 2020-08-10 DIAGNOSIS — Z6823 Body mass index (BMI) 23.0-23.9, adult: Secondary | ICD-10-CM | POA: Diagnosis not present

## 2020-08-11 DIAGNOSIS — Z8719 Personal history of other diseases of the digestive system: Secondary | ICD-10-CM | POA: Diagnosis not present

## 2020-08-13 DIAGNOSIS — Z8719 Personal history of other diseases of the digestive system: Secondary | ICD-10-CM | POA: Diagnosis not present

## 2020-09-05 DIAGNOSIS — G301 Alzheimer's disease with late onset: Secondary | ICD-10-CM | POA: Diagnosis not present

## 2020-09-05 DIAGNOSIS — F0280 Dementia in other diseases classified elsewhere without behavioral disturbance: Secondary | ICD-10-CM | POA: Diagnosis not present

## 2020-09-05 DIAGNOSIS — F32A Depression, unspecified: Secondary | ICD-10-CM | POA: Diagnosis not present

## 2020-09-05 DIAGNOSIS — F411 Generalized anxiety disorder: Secondary | ICD-10-CM | POA: Diagnosis not present

## 2020-09-05 DIAGNOSIS — G479 Sleep disorder, unspecified: Secondary | ICD-10-CM | POA: Diagnosis not present

## 2020-09-05 DIAGNOSIS — B3749 Other urogenital candidiasis: Secondary | ICD-10-CM | POA: Diagnosis not present

## 2020-09-07 DIAGNOSIS — M9902 Segmental and somatic dysfunction of thoracic region: Secondary | ICD-10-CM | POA: Diagnosis not present

## 2020-09-07 DIAGNOSIS — M9905 Segmental and somatic dysfunction of pelvic region: Secondary | ICD-10-CM | POA: Diagnosis not present

## 2020-09-07 DIAGNOSIS — M9903 Segmental and somatic dysfunction of lumbar region: Secondary | ICD-10-CM | POA: Diagnosis not present

## 2020-09-07 DIAGNOSIS — M9901 Segmental and somatic dysfunction of cervical region: Secondary | ICD-10-CM | POA: Diagnosis not present

## 2020-09-07 DIAGNOSIS — M62830 Muscle spasm of back: Secondary | ICD-10-CM | POA: Diagnosis not present

## 2020-09-21 DIAGNOSIS — R159 Full incontinence of feces: Secondary | ICD-10-CM | POA: Diagnosis not present

## 2020-09-21 DIAGNOSIS — G309 Alzheimer's disease, unspecified: Secondary | ICD-10-CM | POA: Diagnosis not present

## 2020-09-21 DIAGNOSIS — N898 Other specified noninflammatory disorders of vagina: Secondary | ICD-10-CM | POA: Diagnosis not present

## 2020-09-21 DIAGNOSIS — R32 Unspecified urinary incontinence: Secondary | ICD-10-CM | POA: Diagnosis not present

## 2020-09-21 DIAGNOSIS — F0280 Dementia in other diseases classified elsewhere without behavioral disturbance: Secondary | ICD-10-CM | POA: Diagnosis not present

## 2020-09-21 DIAGNOSIS — K219 Gastro-esophageal reflux disease without esophagitis: Secondary | ICD-10-CM | POA: Diagnosis not present

## 2020-09-21 DIAGNOSIS — N390 Urinary tract infection, site not specified: Secondary | ICD-10-CM | POA: Diagnosis not present

## 2020-10-29 ENCOUNTER — Other Ambulatory Visit: Payer: Self-pay

## 2020-10-29 ENCOUNTER — Inpatient Hospital Stay (HOSPITAL_COMMUNITY)
Admission: EM | Admit: 2020-10-29 | Discharge: 2020-11-02 | DRG: 177 | Disposition: A | Discharge status: Home / Self Care | Payer: Medicare Other | Attending: Family Medicine | Admitting: Family Medicine

## 2020-10-29 ENCOUNTER — Emergency Department (HOSPITAL_COMMUNITY): Payer: Medicare Other

## 2020-10-29 DIAGNOSIS — J690 Pneumonitis due to inhalation of food and vomit: Secondary | ICD-10-CM | POA: Diagnosis not present

## 2020-10-29 DIAGNOSIS — Z20822 Contact with and (suspected) exposure to covid-19: Secondary | ICD-10-CM | POA: Diagnosis present

## 2020-10-29 DIAGNOSIS — Z66 Do not resuscitate: Secondary | ICD-10-CM | POA: Diagnosis present

## 2020-10-29 DIAGNOSIS — R627 Adult failure to thrive: Secondary | ICD-10-CM | POA: Diagnosis present

## 2020-10-29 DIAGNOSIS — D3502 Benign neoplasm of left adrenal gland: Secondary | ICD-10-CM | POA: Diagnosis not present

## 2020-10-29 DIAGNOSIS — F0281 Dementia in other diseases classified elsewhere with behavioral disturbance: Secondary | ICD-10-CM | POA: Diagnosis not present

## 2020-10-29 DIAGNOSIS — R1310 Dysphagia, unspecified: Secondary | ICD-10-CM | POA: Diagnosis present

## 2020-10-29 DIAGNOSIS — F0280 Dementia in other diseases classified elsewhere without behavioral disturbance: Secondary | ICD-10-CM | POA: Diagnosis not present

## 2020-10-29 DIAGNOSIS — K219 Gastro-esophageal reflux disease without esophagitis: Secondary | ICD-10-CM | POA: Diagnosis present

## 2020-10-29 DIAGNOSIS — L89154 Pressure ulcer of sacral region, stage 4: Secondary | ICD-10-CM | POA: Diagnosis present

## 2020-10-29 DIAGNOSIS — R911 Solitary pulmonary nodule: Secondary | ICD-10-CM | POA: Diagnosis not present

## 2020-10-29 DIAGNOSIS — Z515 Encounter for palliative care: Secondary | ICD-10-CM | POA: Diagnosis not present

## 2020-10-29 DIAGNOSIS — I700 Atherosclerosis of aorta: Secondary | ICD-10-CM | POA: Diagnosis not present

## 2020-10-29 DIAGNOSIS — F419 Anxiety disorder, unspecified: Secondary | ICD-10-CM | POA: Diagnosis present

## 2020-10-29 DIAGNOSIS — R64 Cachexia: Secondary | ICD-10-CM | POA: Diagnosis present

## 2020-10-29 DIAGNOSIS — Z888 Allergy status to other drugs, medicaments and biological substances status: Secondary | ICD-10-CM

## 2020-10-29 DIAGNOSIS — R638 Other symptoms and signs concerning food and fluid intake: Secondary | ICD-10-CM | POA: Diagnosis not present

## 2020-10-29 DIAGNOSIS — Z950 Presence of cardiac pacemaker: Secondary | ICD-10-CM

## 2020-10-29 DIAGNOSIS — J189 Pneumonia, unspecified organism: Secondary | ICD-10-CM | POA: Diagnosis not present

## 2020-10-29 DIAGNOSIS — J4530 Mild persistent asthma, uncomplicated: Secondary | ICD-10-CM | POA: Diagnosis present

## 2020-10-29 DIAGNOSIS — I10 Essential (primary) hypertension: Secondary | ICD-10-CM | POA: Diagnosis not present

## 2020-10-29 DIAGNOSIS — I480 Paroxysmal atrial fibrillation: Secondary | ICD-10-CM | POA: Diagnosis present

## 2020-10-29 DIAGNOSIS — J9601 Acute respiratory failure with hypoxia: Secondary | ICD-10-CM | POA: Diagnosis present

## 2020-10-29 DIAGNOSIS — R1111 Vomiting without nausea: Secondary | ICD-10-CM | POA: Diagnosis not present

## 2020-10-29 DIAGNOSIS — J439 Emphysema, unspecified: Secondary | ICD-10-CM | POA: Diagnosis not present

## 2020-10-29 DIAGNOSIS — G301 Alzheimer's disease with late onset: Secondary | ICD-10-CM | POA: Diagnosis not present

## 2020-10-29 DIAGNOSIS — I959 Hypotension, unspecified: Secondary | ICD-10-CM | POA: Diagnosis not present

## 2020-10-29 DIAGNOSIS — F32A Depression, unspecified: Secondary | ICD-10-CM | POA: Diagnosis not present

## 2020-10-29 DIAGNOSIS — R0902 Hypoxemia: Secondary | ICD-10-CM | POA: Diagnosis not present

## 2020-10-29 DIAGNOSIS — Z79899 Other long term (current) drug therapy: Secondary | ICD-10-CM | POA: Diagnosis not present

## 2020-10-29 DIAGNOSIS — L8990 Pressure ulcer of unspecified site, unspecified stage: Secondary | ICD-10-CM | POA: Insufficient documentation

## 2020-10-29 DIAGNOSIS — I495 Sick sinus syndrome: Secondary | ICD-10-CM | POA: Diagnosis present

## 2020-10-29 DIAGNOSIS — E785 Hyperlipidemia, unspecified: Secondary | ICD-10-CM | POA: Diagnosis present

## 2020-10-29 DIAGNOSIS — G309 Alzheimer's disease, unspecified: Secondary | ICD-10-CM | POA: Diagnosis not present

## 2020-10-29 DIAGNOSIS — J449 Chronic obstructive pulmonary disease, unspecified: Secondary | ICD-10-CM | POA: Diagnosis not present

## 2020-10-29 DIAGNOSIS — D3500 Benign neoplasm of unspecified adrenal gland: Secondary | ICD-10-CM

## 2020-10-29 DIAGNOSIS — R9431 Abnormal electrocardiogram [ECG] [EKG]: Secondary | ICD-10-CM | POA: Diagnosis not present

## 2020-10-29 LAB — BASIC METABOLIC PANEL
Anion gap: 11 (ref 5–15)
BUN: 12 mg/dL (ref 8–23)
CO2: 24 mmol/L (ref 22–32)
Calcium: 9.2 mg/dL (ref 8.9–10.3)
Chloride: 105 mmol/L (ref 98–111)
Creatinine, Ser: 1.03 mg/dL — ABNORMAL HIGH (ref 0.44–1.00)
GFR, Estimated: 54 mL/min — ABNORMAL LOW (ref >60)
Glucose, Bld: 144 mg/dL — ABNORMAL HIGH (ref 70–99)
Potassium: 3.6 mmol/L (ref 3.5–5.1)
Sodium: 140 mmol/L (ref 135–145)

## 2020-10-29 LAB — CBC WITH DIFFERENTIAL/PLATELET
Abs Immature Granulocytes: 0.2 10*3/uL — ABNORMAL HIGH (ref 0.00–0.07)
Basophils Absolute: 0 10*3/uL (ref 0.0–0.1)
Basophils Relative: 0 %
Eosinophils Absolute: 0 10*3/uL (ref 0.0–0.5)
Eosinophils Relative: 0 %
HCT: 40.1 % (ref 36.0–46.0)
Hemoglobin: 12.8 g/dL (ref 12.0–15.0)
Immature Granulocytes: 1 %
Lymphocytes Relative: 2 %
Lymphs Abs: 0.3 10*3/uL — ABNORMAL LOW (ref 0.7–4.0)
MCH: 26.7 pg (ref 26.0–34.0)
MCHC: 31.9 g/dL (ref 30.0–36.0)
MCV: 83.5 fL (ref 80.0–100.0)
Monocytes Absolute: 0.9 10*3/uL (ref 0.1–1.0)
Monocytes Relative: 5 %
Neutro Abs: 15.6 10*3/uL — ABNORMAL HIGH (ref 1.7–7.7)
Neutrophils Relative %: 92 %
Platelets: 246 10*3/uL (ref 150–400)
RBC: 4.8 MIL/uL (ref 3.87–5.11)
RDW: 17.3 % — ABNORMAL HIGH (ref 11.5–15.5)
WBC: 16.9 10*3/uL — ABNORMAL HIGH (ref 4.0–10.5)
nRBC: 0 % (ref 0.0–0.2)

## 2020-10-29 LAB — RESP PANEL BY RT-PCR (FLU A&B, COVID) ARPGX2
Influenza A by PCR: NEGATIVE
Influenza B by PCR: NEGATIVE
SARS Coronavirus 2 by RT PCR: NEGATIVE

## 2020-10-29 MED ORDER — HEPARIN SODIUM (PORCINE) 5000 UNIT/ML IJ SOLN: 5000.0000 [IU] | Freq: Three times a day (TID) | INTRAMUSCULAR | Status: DC

## 2020-10-29 MED ORDER — SODIUM CHLORIDE 0.9 % IV SOLN
3.0000 g | Freq: Three times a day (TID) | INTRAVENOUS | Status: DC
  Administered 2020-10-30 (×2): 3 g via INTRAVENOUS
  Filled 2020-10-29 (×2): qty 8
  Filled 2020-10-29: qty 3
  Filled 2020-10-29: qty 8

## 2020-10-29 MED ORDER — AMOXICILLIN-POT CLAVULANATE 875-125 MG PO TABS
1.0000 | ORAL_TABLET | Freq: Once | ORAL | Status: DC
  Filled 2020-10-29: qty 1

## 2020-10-29 MED ORDER — ENOXAPARIN SODIUM 40 MG/0.4ML IJ SOSY: 40.0000 mg | PREFILLED_SYRINGE | INTRAMUSCULAR | Status: DC

## 2020-10-29 MED ORDER — SODIUM CHLORIDE 0.9 % IV SOLN
3.0000 g | Freq: Once | INTRAVENOUS | Status: AC
  Administered 2020-10-29: 3 g via INTRAVENOUS
  Filled 2020-10-29: qty 8

## 2020-10-29 MED ORDER — SODIUM CHLORIDE 0.9 % IV SOLN: INTRAVENOUS | Status: AC

## 2020-10-29 NOTE — ED Triage Notes (Addendum)
Pt BIB EMS due to family calling doe pt coughing. Pt recently got diagnosied with bacterial vaginosis and was taking flagyl. Pt was eating crackers and would not stop coughing and family thinks pt aspirated. Pt has dementia and alzheimer at baseline. Pt has a hx of COPD. Pt  Does not wear O2 at home. Pt lives with son.

## 2020-10-29 NOTE — ED Notes (Signed)
RN was about to start pts IV and pt requested to hold off until they make up a decision about pts care.

## 2020-10-29 NOTE — Progress Notes (Signed)
[facility]) hospital liaison note  Asked by Dr. Langston Masker to meet with family to assess for hospice eligibility and to discuss process for admission onto hospice services.  Met at bedside and then privately with pt's son Laverna Peace, who shared his mother's disease trajectory with dementia.  We were joined by phone by [name]'s daughter Janett Billow who, along with her mother, provides much of the hands on care to the pt.  Challenges around med administration and bathing were discussed and education offered.  Plan of care discussed.  [name] verbalizes understanding that treating any infections at this point will not change the overall trajectory but wishes to seek treatment if offered.  Laverna Peace does say that when he met previously with [facility] he was not ready to accept hospice services; he shares that he is at this time.   Contact information provided.  Laverna Peace did mention that they live close to the [place] in [place]; this RN validated that family must make the decision for a hospice provider that is the best fit for their family.    [place] liaisons will continue to follow if desired by the family.  Thank you for the opportunity to participate in this pt's care.  Domenic Moras, BSN, RN [facility] liaison [phone] (24h on call)

## 2020-10-29 NOTE — ED Notes (Signed)
Patient transported to CT 

## 2020-10-29 NOTE — H&P (Addendum)
History and Physical    Jodi Thompson [id number] DOB: [birth year] DOA: 10/29/2020  PCP: Cyndi Bender, PA-C Patient coming from: Home  Chief Complaint: Concern for aspiration  HPI: Jodi Thompson is a 86 y.o. female with medical history significant of advanced Alzheimer's dementia, paroxysmal A. fib not on anticoagulation due to history of recent GI bleed, tachybrady syndrome status post PPM, history of SVT status post ablation, hypertension, hyperlipidemia, depression, anxiety, GERD, mild persistent reactive airway disease, GI bleed with aspiration pneumonia (intubated and treated at [facility] 3/22).  Patient is presenting to the ED via EMS for evaluation of cough and possible aspiration.  Patient's son reported to ED physician that the patient lives with them and had an episode of vomiting, they were concerned that she may have aspirated.  They also reported that when patient tried to eat crackers she began choking.  Son reported that patient cannot swallow pills and is barely drinking any fluids.  Reported significant decline in cognitive function and refusal to eat and drink.  Family is attempting to set up home hospice but are not able to do that for at least another 3 weeks.  In the ED, patient's son had a prolonged discussion with hospice service and decided that the patient should be admitted for medical treatment including IV fluids and antibiotics.  Family is still deciding on the best hospice options but they are not ready to commit to hospice at this time.  ED physician Dr. Octaviano Glow communicated to me that he had a conversation with the patient's son Jodi Thompson who confirmed that the patient is DNR/DNI.  In the ED, satting 88-89% on room air at rest, sats improved to mid 90s with 3 L supplemental oxygen.  Labs showing WBC 16.9, hemoglobin 12.8, platelet count 246k.  Sodium 140, potassium 3.6, chloride 105, bicarb 24, BUN 12, creatinine 1.0, glucose 144.  COVID and influenza PCR negative.  CT  showing multifocal groundglass and peribronchovascular airspace opacities concerning for aspiration pneumonia.  Patient was given Unasyn.  Unable to obtain any history from the patient given her advanced dementia.  She is oriented to self only.  I called the patient's son Jodi Thompson twice at the number listed in the chart ([phone]) and both times an individual picked up the phone and hung up after I introduced myself.  Unable to reach family.  Review of Systems:  All systems reviewed and apart from history of presenting illness, are negative.  Past medical history: See HPI.  Per review of care everywhere.  Social history: Unable to obtain given patient's advanced dementia.  Unable to reach family at this time.  Allergies: Unable to obtain any information from the patient given advanced dementia.  Unable to reach family at this time.  Per review of care everywhere, patient is allergic to Tessalon Perles (shortness of breath) and atorvastatin.  Family history: Unable to obtain given patient's advanced dementia.  Unable to reach family at this time.  Prior to Admission medications   Not on File    Physical Exam: Vitals:   10/29/20 1830 10/29/20 1845 10/29/20 1915 10/29/20 1930  BP: 116/70 108/84 113/74 119/62  Pulse: 80 78 78 85  Resp: '18 18 20 19  '$ Temp:      TempSrc:      SpO2: 92% 90% 91% 90%    Physical Exam Constitutional:      General: She is not in acute distress. HENT:     Head: Normocephalic and atraumatic.     Mouth/Throat:  Mouth: Mucous membranes are dry.  Eyes:     Extraocular Movements: Extraocular movements intact.     Conjunctiva/sclera: Conjunctivae normal.  Cardiovascular:     Rate and Rhythm: Normal rate and regular rhythm.     Pulses: Normal pulses.  Pulmonary:     Effort: Pulmonary effort is normal. No respiratory distress.     Breath sounds: Rales present. No wheezing.  Abdominal:     General: Bowel sounds are normal. There is no distension.      Palpations: Abdomen is soft.     Tenderness: There is no abdominal tenderness.  Musculoskeletal:        General: No swelling or tenderness.     Cervical back: Normal range of motion and neck supple.  Skin:    General: Skin is warm and dry.  Neurological:     General: No focal deficit present.     Mental Status: She is alert.     Comments: Oriented to self only     Labs on Admission: I have personally reviewed following labs and imaging studies  CBC: Recent Labs  Lab 10/29/20 1641  WBC 16.9*  NEUTROABS 15.6*  HGB 12.8  HCT 40.1  MCV 83.5  PLT 0000000   Basic Metabolic Panel: Recent Labs  Lab 10/29/20 1641  NA 140  K 3.6  CL 105  CO2 24  GLUCOSE 144*  BUN 12  CREATININE 1.03*  CALCIUM 9.2   GFR: CrCl cannot be calculated (Unknown ideal weight.). Liver Function Tests: No results for input(s): AST, ALT, ALKPHOS, BILITOT, PROT, ALBUMIN in the last 168 hours. No results for input(s): LIPASE, AMYLASE in the last 168 hours. No results for input(s): AMMONIA in the last 168 hours. Coagulation Profile: No results for input(s): INR, PROTIME in the last 168 hours. Cardiac Enzymes: No results for input(s): CKTOTAL, CKMB, CKMBINDEX, TROPONINI in the last 168 hours. BNP (last 3 results) No results for input(s): PROBNP in the last 8760 hours. HbA1C: No results for input(s): HGBA1C in the last 72 hours. CBG: No results for input(s): GLUCAP in the last 168 hours. Lipid Profile: No results for input(s): CHOL, HDL, LDLCALC, TRIG, CHOLHDL, LDLDIRECT in the last 72 hours. Thyroid Function Tests: No results for input(s): TSH, T4TOTAL, FREET4, T3FREE, THYROIDAB in the last 72 hours. Anemia Panel: No results for input(s): VITAMINB12, FOLATE, FERRITIN, TIBC, IRON, RETICCTPCT in the last 72 hours. Urine analysis: No results found for: COLORURINE, APPEARANCEUR, [place], [place], GLUCOSEU, HGBUR, BILIRUBINUR, KETONESUR, PROTEINUR, UROBILINOGEN, NITRITE, LEUKOCYTESUR  Radiological Exams  on Admission: CT Chest Wo Contrast  Result Date: 10/29/2020 CLINICAL DATA:  Cough. Concern for aspiration pneumonia. Dementia. COPD. EXAM: CT CHEST WITHOUT CONTRAST TECHNIQUE: Multidetector CT imaging of the chest was performed following the standard protocol without IV contrast. COMPARISON:  06/27/2020 chest radiograph. FINDINGS: Cardiovascular: Mild motion degradation throughout. Aortic atherosclerosis. Pacer. Lad coronary artery calcification. Mediastinum/Nodes: Mildly prominent, macrolobulated right lobe of the thyroid suggests multinodular goiter. No mediastinal or definite hilar adenopathy, given limitations of unenhanced CT. Lungs/Pleura: No pleural fluid.  Mild centrilobular emphysema. Within both lower lobes, inferior right middle lobe, minimally within the lingula, and within the right apex are areas of ground-glass and peribronchovascular airspace. Upper Abdomen: Multiple hepatic cysts. Cholecystectomy. Artifact degradation from patient right greater than left hand position. Normal imaged portions of the spleen, stomach, pancreas, left kidney. 1.8 cm left adrenal low-density nodule including on 146/5. A 1.2 cm right adrenal nodule has indeterminate density measurements but is statistically most likely an adenoma. Abdominal aortic  atherosclerosis. Musculoskeletal: No acute osseous abnormality. IMPRESSION: 1. Multifactorial degradation, including positioning and motion. 2. Multifocal ground-glass and peribronchovascular airspace opacities, consistent with (given clinical history) aspiration versus infection. 3. Aortic atherosclerosis (ICD10-I70.0), coronary artery atherosclerosis and emphysema (ICD10-J43.9). 4. Left adrenal adenoma. Right adrenal nodule is technically indeterminate but most likely also an adenoma. Electronically Signed   By: Abigail Miyamoto M.D.   On: 10/29/2020 13:26    Assessment/Plan Principal Problem:   Aspiration pneumonia (HCC) Active Problems:   Acute hypoxemic respiratory  failure (HCC)   Dysphagia   Adrenal adenoma   Alzheimer's dementia ([place])   Acute hypoxemic respiratory failure secondary to aspiration pneumonia Satting 88-89% on room air at rest, sats improved to mid 90s with 3 L supplemental oxygen.  No increased work of breathing.  WBC 16.9.  No fever, tachycardia, or hypotension.  COVID and influenza PCR negative.  CT showing multifocal groundglass and peribronchovascular airspace opacities concerning for aspiration pneumonia. -Continue Unasyn.  Blood culture x2 ordered.  Continue to monitor WBC count.  Continuous pulse ox, continue supplemental oxygen.  Dysphagia/ failure to thrive -Keep n.p.o., gentle IV fluid hydration.  SLP eval.  Unable to reach family at this time.  There needs to be a discussion with the family in the morning about whether they want to pursue additional work-up/EGD to rule out malignancy.  Adrenal adenoma CT showing left adrenal adenoma; right adrenal nodule is technically indeterminate but most likely also an adenoma. -Unable to reach family at this time.  There needs to be a discussion in the morning regarding whether they would like to pursue further work-up.  Advanced Alzheimer's dementia -Resume home medications after pharmacy med rec is done.  Follow delirium precautions. Addendum: PO meds only if she passes swallow eval in am, npo at this time  Paroxysmal A. fib Not on chronic anticoagulation due to history of GI bleed. -EKG ordered  Tachybrady syndrome -Status post PPM  Hypertension Stable. -Resume home medications after pharmacy med rec is done. Addendum: and if she passes swallow eval, npo at this time  Hyperlipidemia -Resume home medication after pharmacy med rec is done. Addendum: PO meds only if she passes swallow eval in am, npo at this time  Depression, anxiety -Resume home medications after pharmacy med rec is done. Addendum: PO meds only if she passes swallow eval in am, npo at this  time  GERD -Resume home medication after pharmacy med rec is done. Addendum: PO meds only if she passes swallow eval in am, npo at this time  Mild persistent reactive airway disease Stable, not wheezing. -Resume home meds after pharmacy med rec is done.  Goals of care -Palliative care consulted.  DVT prophylaxis: Subcutaneous heparin Addendum 10/29/2020 at 9:48 pm: SCDs ordered for DVT ppx. Per ED RN, when patient's son was in the ED earlier today, he did not want any medications other than antibiotics and IV fluid.  Code Status: DNR/DNI per ED physician's conversation with the patient's son. Family Communication: I called the patient's son Saly Hoskin twice at the number listed in the chart [phone]) and both times an individual picked up the phone and hung up after I introduced myself.  Unable to reach family. Disposition Plan: Status is: Inpatient  Remains inpatient appropriate because:Inpatient level of care appropriate due to severity of illness  Dispo: The patient is from: Home              Anticipated d/c is to:  Likely to hospice  Patient currently is not medically stable to d/c.   Difficult to place patient No  Consults called: Palliative care Level of care: Level of care: Telemetry Medical  The medical decision making on this patient was of high complexity and the patient is at high risk for clinical deterioration, therefore this is a level 3 visit.  Shela Leff MD [facility]  If 7PM-7AM, please contact night-coverage [email or web address]  10/29/2020, 9:16 PM

## 2020-10-29 NOTE — Progress Notes (Signed)
Pharmacy Antibiotic Note  Jodi Thompson is a 86 y.o. female admitted on 10/29/2020 with  aspiration pneumonia .  Pharmacy has been consulted for unasyn dosing.  Plan: Unasyn 3g q8h Monitor renal function F/u ID course De-escalate antibiotics when appropriate     Temp (24hrs), Avg:97.4 F (36.3 C), Min:97.4 F (36.3 C), Max:97.4 F (36.3 C)  Recent Labs  Lab 10/29/20 1641  WBC 16.9*  CREATININE 1.03*    CrCl cannot be calculated (Unknown ideal weight.).    Allergies  Allergen Reactions   Tessalon [Benzonatate] Shortness Of Breath   Atorvastatin     Antimicrobials this admission: Unasyn 7/31 >>  Microbiology results: Pending  Thank you for allowing pharmacy to be a part of this patient's care.  Jodi Thompson 10/29/2020 9:17 PM

## 2020-10-29 NOTE — ED Provider Notes (Signed)
[facility] EMERGENCY DEPARTMENT Provider Note   CSN: [id number] Arrival date & time: 10/29/20  1217     History Chief Complaint  Patient presents with  . Cough    Jodi Thompson is a 86 y.o. female with history of advanced Alzheimer's dementia presenting from home by EMS for concern for coughing and possible aspiration.  EMS provided the history as the patient's family member 77 was not immediately available by phone or in person.  They reported that the family member witnessed the patient eating crackers today and she would not stop coughing.  They are concerned she may have aspirated.  She recently completed a course of Flagyl for bacterial vaginosis.  The patient has advanced and then she cannot provide any further history.  Update - patient's son now present at bedside, Jodi Thompson.  He reports that the patient lives with them.  He stated that earlier today the patient had a episode of vomiting, there were concerned she may have aspirated.  They tried to clean her up and brought her down to the kitchen to eat, and she again began choking on some crackers.  He reports that she has had significant decline in cognitive function and refusal to eat and drink.    They are in discussion with [facility] to establish home hospice but have not been able to set this up.  The patient is DNR/DNI.  HPI     No past medical history on file.  Patient Active Problem List   Diagnosis Date Noted  . Aspiration pneumonia ([place]) 10/29/2020  . Acute hypoxemic respiratory failure ([place]) 10/29/2020  . Dysphagia 10/29/2020  . Adrenal adenoma 10/29/2020  . Alzheimer's dementia ([place]) 10/29/2020    OB History   No obstetric history on file.     No family history on file.     Home Medications Prior to Admission medications   Medication Sig Start Date End Date Taking? Authorizing Provider  donepezil (ARICEPT) 10 MG tablet Take 10 mg by mouth daily. 06/23/20   [provider]  ezetimibe (ZETIA) 10 MG tablet Take 10 mg by mouth daily. 09/14/20   [provider]  LORazepam (ATIVAN) 1 MG tablet Take 1 mg by mouth 2 (two) times daily. 06/21/20   [provider]  metoprolol tartrate (LOPRESSOR) 25 MG tablet Take 25 mg by mouth 2 (two) times daily. 09/14/20   [provider]  metroNIDAZOLE (FLAGYL) 500 MG tablet Take 500 mg by mouth 2 (two) times daily. 10/13/20   [provider]  pantoprazole (PROTONIX) 40 MG tablet Take 40 mg by mouth daily. 10/07/20   [provider]  sertraline (ZOLOFT) 100 MG tablet Take by mouth. 09/14/20   [provider]  spironolactone (ALDACTONE) 25 MG tablet Take 25 mg by mouth daily. 09/14/20   [provider]  traZODone (DESYREL) 50 MG tablet Take 50 mg by mouth at bedtime. 10/26/20   [provider]    Allergies    Tessalon [benzonatate] and Atorvastatin  Review of Systems   Review of Systems  Unable to perform ROS: Dementia (level 5 caveat)   Physical Exam Updated Vital Signs BP 124/62   Pulse 82   Temp (!) 97.4 F (36.3 C) (Oral)   Resp 18   SpO2 95%   Physical Exam Constitutional:      General: She is not in acute distress. HENT:     Head: Normocephalic and atraumatic.  Eyes:     Conjunctiva/sclera: Conjunctivae normal.  Pupils: Pupils are equal, round, and reactive to light.  Cardiovascular:     Rate and Rhythm: Normal rate and regular rhythm.  Pulmonary:     Effort: Pulmonary effort is normal. No respiratory distress.  Abdominal:     General: There is no distension.     Tenderness: There is no abdominal tenderness.  Skin:    General: Skin is warm and dry.  Neurological:     Mental Status: She is alert.    ED Results / Procedures / Treatments   Labs (all labs ordered are listed, but only abnormal results are displayed) Labs Reviewed  BASIC METABOLIC PANEL - Abnormal; Notable for the following components:      Result Value   Glucose, Bld  144 (*)    Creatinine, Ser 1.03 (*)    GFR, Estimated 54 (*)    All other components within normal limits  CBC WITH DIFFERENTIAL/PLATELET - Abnormal; Notable for the following components:   WBC 16.9 (*)    RDW 17.3 (*)    Neutro Abs 15.6 (*)    Lymphs Abs 0.3 (*)    Abs Immature Granulocytes 0.20 (*)    All other components within normal limits  RESP PANEL BY RT-PCR (FLU A&B, COVID) ARPGX2  CULTURE, BLOOD (ROUTINE X 2)  CULTURE, BLOOD (ROUTINE X 2)  CBC    EKG None  Radiology CT Chest Wo Contrast  Result Date: 10/29/2020 CLINICAL DATA:  Cough. Concern for aspiration pneumonia. Dementia. COPD. EXAM: CT CHEST WITHOUT CONTRAST TECHNIQUE: Multidetector CT imaging of the chest was performed following the standard protocol without IV contrast. COMPARISON:  06/27/2020 chest radiograph. FINDINGS: Cardiovascular: Mild motion degradation throughout. Aortic atherosclerosis. Pacer. Lad coronary artery calcification. Mediastinum/Nodes: Mildly prominent, macrolobulated right lobe of the thyroid suggests multinodular goiter. No mediastinal or definite hilar adenopathy, given limitations of unenhanced CT. Lungs/Pleura: No pleural fluid.  Mild centrilobular emphysema. Within both lower lobes, inferior right middle lobe, minimally within the lingula, and within the right apex are areas of ground-glass and peribronchovascular airspace. Upper Abdomen: Multiple hepatic cysts. Cholecystectomy. Artifact degradation from patient right greater than left hand position. Normal imaged portions of the spleen, stomach, pancreas, left kidney. 1.8 cm left adrenal low-density nodule including on 146/5. A 1.2 cm right adrenal nodule has indeterminate density measurements but is statistically most likely an adenoma. Abdominal aortic atherosclerosis. Musculoskeletal: No acute osseous abnormality. IMPRESSION: 1. Multifactorial degradation, including positioning and motion. 2. Multifocal ground-glass and peribronchovascular  airspace opacities, consistent with (given clinical history) aspiration versus infection. 3. Aortic atherosclerosis (ICD10-I70.0), coronary artery atherosclerosis and emphysema (ICD10-J43.9). 4. Left adrenal adenoma. Right adrenal nodule is technically indeterminate but most likely also an adenoma. Electronically Signed   By: Abigail Miyamoto M.D.   On: 10/29/2020 13:26    Procedures Procedures   Medications Ordered in ED Medications  0.9 %  sodium chloride infusion ( Intravenous New Bag/Given 10/29/20 2226)  Ampicillin-Sulbactam (UNASYN) 3 g in sodium chloride 0.9 % 100 mL IVPB (has no administration in time range)  Ampicillin-Sulbactam (UNASYN) 3 g in sodium chloride 0.9 % 100 mL IVPB (0 g Intravenous Stopped 10/29/20 1919)    ED Course  I have reviewed the triage vital signs and the nursing notes.  Pertinent labs & imaging results that were available during my care of the patient were reviewed by me and considered in my medical decision making (see chart for details).  Patient arrives from home with concern for possible aspiration.  She has advanced dementia cannot provide any  further history.  She is not in respiratory distress.  We'll proceed to imaging to evaluate for aspiration event.  Clinical Course as of 10/29/20 2257  Nancy Fetter Oct 29, 2020  1449 Patient son is now present at bedside.  He reports the patient cannot swallow pills and is barely drinking any fluids.  They are attempting to set up home hospice but are not able to do that for at least another 3 weeks.  I placed a call to our palliative service to see if they can assist with this. [MT]  1508 The patient son at bedside had a prolonged discussion with hospice service, and decided that at this time he would like her admitted for medical treatment.  She will need an IV and IV antibiotics that she cannot tolerate p.o. and is a high aspiration risk.  They are still deciding on best hospice options, but as they are not ready to commit to  hospice at this time, the patient will need medical admission for IV fluids and antibiotics.  She is DNR/DNI. [MT]    Clinical Course User Index [MT] Jasmarie Coppock, Carola Rhine, MD    Final Clinical Impression(s) / ED Diagnoses Final diagnoses:  Aspiration pneumonia, unspecified aspiration pneumonia type, unspecified laterality, unspecified part of lung [facility])    Rx / DC Orders ED Discharge Orders     None        Wyvonnia Dusky, MD 10/29/20 2257

## 2020-10-30 ENCOUNTER — Encounter (HOSPITAL_COMMUNITY): Payer: Self-pay | Admitting: Internal Medicine

## 2020-10-30 DIAGNOSIS — F0281 Dementia in other diseases classified elsewhere with behavioral disturbance: Secondary | ICD-10-CM

## 2020-10-30 DIAGNOSIS — J9601 Acute respiratory failure with hypoxia: Secondary | ICD-10-CM

## 2020-10-30 DIAGNOSIS — G301 Alzheimer's disease with late onset: Secondary | ICD-10-CM

## 2020-10-30 DIAGNOSIS — R1310 Dysphagia, unspecified: Secondary | ICD-10-CM

## 2020-10-30 DIAGNOSIS — R638 Other symptoms and signs concerning food and fluid intake: Secondary | ICD-10-CM

## 2020-10-30 LAB — BLOOD CULTURE ID PANEL (REFLEXED) - BCID2

## 2020-10-30 LAB — CBC
HCT: 40.8 % (ref 36.0–46.0)
Hemoglobin: 12.7 g/dL (ref 12.0–15.0)
MCH: 26 pg (ref 26.0–34.0)
MCHC: 31.1 g/dL (ref 30.0–36.0)
MCV: 83.6 fL (ref 80.0–100.0)
Platelets: 221 10*3/uL (ref 150–400)
RBC: 4.88 MIL/uL (ref 3.87–5.11)
RDW: 17.7 % — ABNORMAL HIGH (ref 11.5–15.5)
WBC: 13.6 10*3/uL — ABNORMAL HIGH (ref 4.0–10.5)
nRBC: 0 % (ref 0.0–0.2)

## 2020-10-30 MED ORDER — SODIUM CHLORIDE 0.9 % IV SOLN
2.0000 g | INTRAVENOUS | Status: DC
  Administered 2020-10-30 – 2020-11-02 (×4): 2 g via INTRAVENOUS
  Filled 2020-10-30 (×2): qty 2
  Filled 2020-10-30: qty 20
  Filled 2020-10-30 (×2): qty 2

## 2020-10-30 MED ORDER — QUETIAPINE FUMARATE 50 MG PO TABS: 25.0000 mg | ORAL_TABLET | Freq: Every evening | ORAL | Status: DC | PRN

## 2020-10-30 MED ORDER — MELATONIN 3 MG PO TABS
3.0000 mg | ORAL_TABLET | Freq: Every day | ORAL | Status: DC
  Administered 2020-10-30 – 2020-11-01 (×3): 3 mg via ORAL
  Filled 2020-10-30 (×3): qty 1

## 2020-10-30 NOTE — Progress Notes (Signed)
PHARMACY - PHYSICIAN COMMUNICATION CRITICAL VALUE ALERT - BLOOD CULTURE IDENTIFICATION (BCID)  Jodi Thompson is an 86 y.o. female who presented to [facility] on 10/29/2020 with a chief complaint of coughing and possible aspiration  Assessment:  86 year old woman on Unasyn for aspiration pneumonia found to have one blood culture positive for K. Pneumoniae, no resistance markers detected.  Name of physician (or Provider) Contacted: Dr. Aileen Fass paged  Current antibiotics: Unasyn  Changes to prescribed antibiotics recommended: Change unasyn to ceftriaxone 2g IV q24h per BCID recommendations   Results for orders placed or performed during the hospital encounter of 10/29/20  Blood Culture ID Panel (Reflexed) (Collected: 10/29/2020  9:18 PM)  Result Value Ref Range   Enterococcus faecalis NOT DETECTED NOT DETECTED   Enterococcus Faecium NOT DETECTED NOT DETECTED   Listeria monocytogenes NOT DETECTED NOT DETECTED   Staphylococcus species NOT DETECTED NOT DETECTED   Staphylococcus aureus (BCID) NOT DETECTED NOT DETECTED   Staphylococcus epidermidis NOT DETECTED NOT DETECTED   Staphylococcus lugdunensis NOT DETECTED NOT DETECTED   Streptococcus species NOT DETECTED NOT DETECTED   Streptococcus agalactiae NOT DETECTED NOT DETECTED   Streptococcus pneumoniae NOT DETECTED NOT DETECTED   Streptococcus pyogenes NOT DETECTED NOT DETECTED   A.calcoaceticus-baumannii NOT DETECTED NOT DETECTED   Bacteroides fragilis NOT DETECTED NOT DETECTED   Enterobacterales DETECTED (A) NOT DETECTED   Enterobacter cloacae complex NOT DETECTED NOT DETECTED   Escherichia coli NOT DETECTED NOT DETECTED   Klebsiella aerogenes NOT DETECTED NOT DETECTED   Klebsiella oxytoca NOT DETECTED NOT DETECTED   Klebsiella pneumoniae DETECTED (A) NOT DETECTED   Proteus species NOT DETECTED NOT DETECTED   Salmonella species NOT DETECTED NOT DETECTED   Serratia marcescens NOT DETECTED NOT DETECTED   Haemophilus influenzae NOT  DETECTED NOT DETECTED   Neisseria meningitidis NOT DETECTED NOT DETECTED   Pseudomonas aeruginosa NOT DETECTED NOT DETECTED   Stenotrophomonas maltophilia NOT DETECTED NOT DETECTED   Candida albicans NOT DETECTED NOT DETECTED   Candida auris NOT DETECTED NOT DETECTED   Candida glabrata NOT DETECTED NOT DETECTED   Candida krusei NOT DETECTED NOT DETECTED   Candida parapsilosis NOT DETECTED NOT DETECTED   Candida tropicalis NOT DETECTED NOT DETECTED   Cryptococcus neoformans/gattii NOT DETECTED NOT DETECTED   CTX-M ESBL NOT DETECTED NOT DETECTED   Carbapenem resistance IMP NOT DETECTED NOT DETECTED   Carbapenem resistance KPC NOT DETECTED NOT DETECTED   Carbapenem resistance NDM NOT DETECTED NOT DETECTED   Carbapenem resist OXA 48 LIKE NOT DETECTED NOT DETECTED   Carbapenem resistance VIM NOT DETECTED NOT DETECTED    Candie Mile 10/30/2020  3:02 PM

## 2020-10-30 NOTE — Consult Note (Addendum)
[facility] Nurse Consult Note: Patient receiving care in [facility] [phone] Reason for Consult: Sacral wounds Wound type: Stage 2 sacral wounds x 3 on buttocks and coccyx Pressure Injury POA: Yes Measurement: Left buttock 3 cm x 0.4 cm x 0.1 cm Right buttock 2 cm x 1 cm x 0.1 cm Coccyx 1 cm x 0.4 cm x 0.1 cm Wound bed: Pink/Yellow Drainage (amount, consistency, odor) Serosanguinous Periwound: intact Dressing procedure/placement/frequency: Clean the sacral area with soap and water, rinse and pat dry. Place a piece of Xeroform gauze on each wound and secure with a sacral foam dressing with the point tip up to prevent soiling. Change Xeroform daily. Will order air mattress and pressure redistribution chair pad. Prevalon boots for bilat feet.   Monitor the wound area(s) for worsening of condition such as: Signs/symptoms of infection, increase in size, development of or worsening of odor, development of pain, or increased pain at the affected locations.   Notify the medical team if any of these develop.  Thank you for the consult. [place] nurse will not follow at this time.   Please re-consult the [facility] team if needed.  Cathlean Marseilles Tamala Julian, MSN, RN, [place], [place], [facility] Wound Treatment Associate Pager [phone]

## 2020-10-30 NOTE — Consult Note (Signed)
Palliative Medicine Inpatient Consult Note  Consulting Provider: Shela Leff, MD  Reason for consult:   [place] Palliative Medicine Consult  Reason for Consult? Goals of care    HPI:  Jodi Thompson is a 86 y.o. female with medical history significant of advanced Alzheimer's dementia, paroxysmal Afib (not on anticoagulation due to history of recent GI bleed), tachybrady syndrome status post PPM, history of SVT status post ablation, hypertension, hyperlipidemia, depression, anxiety, GERD, mild persistent reactive airway disease, GI bleed with aspiration pneumonia (intubated and treated at [facility] 3/22).    On 10/29/20, [place] met with patients son, Jodi Thompson, and conference called patients daughter, Jodi Thompson. Reviewed the patients downward trajectory over the past few months. Per note review family now accepting of hospice though they are deliberating over which hospice agency to choose.   Presently, Jodi Thompson is being treated with IV ABX and mIVF for aspiration pneumonia in the setting of dysphagia from advanced dementia.   Clinical Assessment/Goals of Care:  I have reviewed medical records including EPIC notes, labs and imaging, received report from bedside RN, assessed the patient and met with patient's son Jodi Thompson in [place] conference room to further discuss diagnosis prognosis, [place], EOL wishes, disposition and options.   He expresses he know his mother is at the end of her life. He also wants to do whatever he can to have as much time with her as he can without doing harm.   I introduced Palliative Medicine as specialized medical care for people living with serious illness. It focuses on providing relief from the symptoms and stress of a serious illness. The goal is to improve quality of life for both the patient and the family.  A detailed discussion was had today regarding advanced care planning.  We reviewed the concepts of code status, artifical hydration,  continued IV antibiotics and rehospitalization.  We discussed the difference between an aggressive medical intervention path and a palliative comfort care path for Jodi Thompson. Values and goals of care important to patient and family were attempted to be elicited.  The son mentioned that he is familiar with the different hospice facilities in the area, but also expressed interest in possibly taking his mother home with hospice. He says he knows he cannot delay his decision too long but would like a few more days before giving a final answer.   We discussed the importance of continued conversation with family and the patient's medical providers regarding overall plan of care and treatment options, ensuring decisions are within the context of the patients values and GOCs.    Decision Maker: Jodi Thompson (son) - [phone]  SUMMARY OF RECOMMENDATIONS     Code Status/Advance Care Planning: DNR  Recommendations (Limitations, Scope, Preferences): Provided patient's son Jodi Thompson with "Hard Choices for Loving People" booklet.  Reviewed MOST form and gave copy to son for his review Offered chaplaincy but he declined Continue to support him as he decides hospice at home vs inpatient Patient's son wants to continue IVF and IV antibiotics for at least the next few days   Psycho-social/Spiritual:  Desire for further Chaplaincy support: patient's son declined   Discharge Planning:   Vitals:   10/30/20 0301 10/30/20 0544  BP: (!) 120/107 131/66  Pulse: 87 86  Resp: 16 15  Temp: 98.4 F (36.9 C) 98.7 F (37.1 C)  SpO2: 96% 92%    Intake/Output Summary (Last 24 hours) at 10/30/2020 0649 Last data filed at 10/30/2020 0600 Gross per 24 hour  Intake  1625.57 ml  Output --  Net 1625.57 ml   Gen:  NAD HEENT: moist mucous membranes ABD: soft/nontender/nondistended/normal bowel sounds EXT: No edema Neuro: Alert and oriented to person, but not place, time, or situation  PPS: 30%   This  conversation/these recommendations were discussed with patient's primary care team, Dr. Aileen Fass  Time In: 1330 Time Out: 4237 Total Time: 75  [place]. Ilsa Iha, FNP-BC Palliative Medicine Team Team Phone # [phone] Pager # [phone]   Greater than 50%  of this time was spent counseling and coordinating care related to the above assessment and plan.

## 2020-10-30 NOTE — Progress Notes (Signed)
[facility] PROGRESS NOTE    Progress Note  Jodi Thompson  [id number] DOB: December 13, 1934 DOA: 10/29/2020 PCP: Cyndi Bender, PA-C     Brief Narrative:   Jodi Thompson is an 86 y.o. female past medical history significant for advanced Alzheimer's dementia, paroxysmal atrial fibrillation not on anticoagulation due to recent GI bleed, tachybradycardia syndrome status post pacemaker, history of SVT status post ablation, essential hypertension GI bleed, with aspiration pneumonia intubated at [facility] on March 2022 comes in via EMS for cough and possible aspiration, the son relates that she lives with them she had an episode of vomiting after she tried crackers and started choking sound ports the patient has difficulties pills and drinking fluid significant decline in cognitive function in the ED satting 89% improved with 2 L of oxygen with a white count of 16 CT of the chest showed multifocal groundglass opacity started on IV Unasyn   Assessment/Plan:   Acute Respiratory failure with hypoxia secondary to Aspiration pneumonia ([place]) With a white count of 16 tachycardic and hypoxic started empirically on IV Unasyn blood cultures have been ordered. Will need to talk to the family about palliative care as this will be it seems like a recurrent theme for her. She has an extremely poor prognosis over the next 6 months, she is at high risk of recurrent aspiration and delirium.   Dysphagia: N.p.o. swallowing evaluation is pending.  Adrenal adenocarcinoma: CT scan showed left adrenal adenoma, will need further work-up as an outpatient if family desires.  Advanced Alzheimer's dementia: Resume home meds, she is at high risk of aspiration and delirium. Started on melatonin at night, Seroquel as needed.  Paroxysmal atrial fibrillation:/Tachybradycardia syndrome Not on anticoagulation rate control.  Essential hypertension: Resume home meds if she passes her swallowing  evaluation.  Hyperlipidemia: Resume home meds.  Depression/anxiety: Resume home meds.   Stage 4 sacral decub ulcer present on admission: RN Pressure Injury Documentation: Pressure Injury 10/30/20 Sacrum Mid Stage 4 - Full thickness tissue loss with exposed bone, tendon or muscle. pt has 3 open places on sacrum, one is much larger than the other two (Active)  10/30/20 0300  Location: Sacrum  Location Orientation: Mid  Staging: Stage 4 - Full thickness tissue loss with exposed bone, tendon or muscle.  Wound Description (Comments): pt has 3 open places on sacrum, one is much larger than the other two  Present on Admission: Yes     DVT prophylaxis: lovenox Family Communication: Unable to reach son Status is: Inpatient  Remains inpatient appropriate because:Hemodynamically unstable  Dispo: The patient is from: Home              Anticipated d/c is to: Home              Patient currently is not medically stable to d/c.   Difficult to place patient No      Code Status:     Code Status Orders  (From admission, onward)           Start     Ordered   10/29/20 2111  Do not attempt resuscitation (DNR)  Continuous       Question Answer Comment  In the event of cardiac or respiratory ARREST Do not call a "code blue"   In the event of cardiac or respiratory ARREST Do not perform Intubation, CPR, defibrillation or ACLS   In the event of cardiac or respiratory ARREST Use medication by any route, position, wound care, and other measures to relive pain and  suffering. May use oxygen, suction and manual treatment of airway obstruction as needed for comfort.      10/29/20 2112           Code Status History     Date Active Date Inactive Code Status Order ID Comments User Context   10/29/2020 2042 10/29/2020 2112 DNR [id number]  Wyvonnia Dusky, MD ED         IV Access:   Peripheral IV   Procedures and diagnostic studies:   CT Chest Wo Contrast  Result Date:  10/29/2020 CLINICAL DATA:  Cough. Concern for aspiration pneumonia. Dementia. COPD. EXAM: CT CHEST WITHOUT CONTRAST TECHNIQUE: Multidetector CT imaging of the chest was performed following the standard protocol without IV contrast. COMPARISON:  06/27/2020 chest radiograph. FINDINGS: Cardiovascular: Mild motion degradation throughout. Aortic atherosclerosis. Pacer. Lad coronary artery calcification. Mediastinum/Nodes: Mildly prominent, macrolobulated right lobe of the thyroid suggests multinodular goiter. No mediastinal or definite hilar adenopathy, given limitations of unenhanced CT. Lungs/Pleura: No pleural fluid.  Mild centrilobular emphysema. Within both lower lobes, inferior right middle lobe, minimally within the lingula, and within the right apex are areas of ground-glass and peribronchovascular airspace. Upper Abdomen: Multiple hepatic cysts. Cholecystectomy. Artifact degradation from patient right greater than left hand position. Normal imaged portions of the spleen, stomach, pancreas, left kidney. 1.8 cm left adrenal low-density nodule including on 146/5. A 1.2 cm right adrenal nodule has indeterminate density measurements but is statistically most likely an adenoma. Abdominal aortic atherosclerosis. Musculoskeletal: No acute osseous abnormality. IMPRESSION: 1. Multifactorial degradation, including positioning and motion. 2. Multifocal ground-glass and peribronchovascular airspace opacities, consistent with (given clinical history) aspiration versus infection. 3. Aortic atherosclerosis (ICD10-I70.0), coronary artery atherosclerosis and emphysema (ICD10-J43.9). 4. Left adrenal adenoma. Right adrenal nodule is technically indeterminate but most likely also an adenoma. Electronically Signed   By: Abigail Miyamoto M.D.   On: 10/29/2020 13:26     Medical Consultants:   None.   Subjective:    Jodi Thompson nonverbal  Objective:    Vitals:   10/29/20 2238 10/30/20 0200 10/30/20 0301 10/30/20 0544  BP:  124/62 134/64 (!) 120/107 131/66  Pulse: 82 86 87 86  Resp: '18 16 16 15  '$ Temp:   98.4 F (36.9 C) 98.7 F (37.1 C)  TempSrc:   Oral Oral  SpO2: 95% 99% 96% 92%   SpO2: 92 % O2 Flow Rate (L/min): 3 L/min   Intake/Output Summary (Last 24 hours) at 10/30/2020 0839 Last data filed at 10/30/2020 0600 Gross per 24 hour  Intake 1625.57 ml  Output --  Net 1625.57 ml   There were no vitals filed for this visit.  Exam: General exam: In no acute distress. Respiratory system: Good air movement and clear to auscultation. Cardiovascular system: S1 & S2 heard, RRR. No JVD. Gastrointestinal system: Abdomen is nondistended, soft and nontender.   Extremities: No pedal edema. Skin: No rashes, lesions or ulcers   Data Reviewed:    Labs: Basic Metabolic Panel: Recent Labs  Lab 10/29/20 1641  NA 140  K 3.6  CL 105  CO2 24  GLUCOSE 144*  BUN 12  CREATININE 1.03*  CALCIUM 9.2   GFR CrCl cannot be calculated (Unknown ideal weight.). Liver Function Tests: No results for input(s): AST, ALT, ALKPHOS, BILITOT, PROT, ALBUMIN in the last 168 hours. No results for input(s): LIPASE, AMYLASE in the last 168 hours. No results for input(s): AMMONIA in the last 168 hours. Coagulation profile No results for input(s): INR, PROTIME in the last 168  hours. COVID-19 Labs  No results for input(s): DDIMER, FERRITIN, LDH, CRP in the last 72 hours.  Lab Results  Component Value Date   [place] NEGATIVE 10/29/2020    CBC: Recent Labs  Lab 10/29/20 1641 10/30/20 0400  WBC 16.9* 13.6*  NEUTROABS 15.6*  --   HGB 12.8 12.7  HCT 40.1 40.8  MCV 83.5 83.6  PLT 246 221   Cardiac Enzymes: No results for input(s): CKTOTAL, CKMB, CKMBINDEX, TROPONINI in the last 168 hours. BNP (last 3 results) No results for input(s): PROBNP in the last 8760 hours. CBG: No results for input(s): GLUCAP in the last 168 hours. D-Dimer: No results for input(s): DDIMER in the last 72 hours. Hgb A1c: No results  for input(s): HGBA1C in the last 72 hours. Lipid Profile: No results for input(s): CHOL, HDL, LDLCALC, TRIG, CHOLHDL, LDLDIRECT in the last 72 hours. Thyroid function studies: No results for input(s): TSH, T4TOTAL, T3FREE, THYROIDAB in the last 72 hours.  Invalid input(s): FREET3 Anemia work up: No results for input(s): VITAMINB12, FOLATE, FERRITIN, TIBC, IRON, RETICCTPCT in the last 72 hours. Sepsis Labs: Recent Labs  Lab 10/29/20 1641 10/30/20 0400  WBC 16.9* 13.6*   Microbiology Recent Results (from the past 240 hour(s))  Resp Panel by RT-PCR (Flu A&B, Covid) Nasopharyngeal Swab     Status: None   Collection Time: 10/29/20  6:02 PM   Specimen: Nasopharyngeal Swab; Nasopharyngeal(NP) swabs in vial transport medium  Result Value Ref Range Status   SARS Coronavirus 2 by RT PCR NEGATIVE NEGATIVE Final    Comment: (NOTE) SARS-CoV-2 target nucleic acids are NOT DETECTED.  The SARS-CoV-2 RNA is generally detectable in upper respiratory specimens during the acute phase of infection. The lowest concentration of SARS-CoV-2 viral copies this assay can detect is 138 copies/mL. A negative result does not preclude SARS-Cov-2 infection and should not be used as the sole basis for treatment or other patient management decisions. A negative result may occur with  improper specimen collection/handling, submission of specimen other than nasopharyngeal swab, presence of viral mutation(s) within the areas targeted by this assay, and inadequate number of viral copies(<138 copies/mL). A negative result must be combined with clinical observations, patient history, and epidemiological information. The expected result is Negative.  Fact Sheet for Patients:  EntrepreneurPulse.com.au  Fact Sheet for Healthcare Providers:  IncredibleEmployment.be  This test is no t yet approved or cleared by the [place] FDA and  has been authorized for detection and/or  diagnosis of SARS-CoV-2 by FDA under an Emergency Use Authorization (EUA). This EUA will remain  in effect (meaning this test can be used) for the duration of the COVID-19 declaration under Section 564(b)(1) of the Act, 21 U.S.C.section 360bbb-3(b)(1), unless the authorization is terminated  or revoked sooner.       Influenza A by PCR NEGATIVE NEGATIVE Final   Influenza B by PCR NEGATIVE NEGATIVE Final    Comment: (NOTE) The Xpert Xpress SARS-CoV-2/FLU/RSV plus assay is intended as an aid in the diagnosis of influenza from Nasopharyngeal swab specimens and should not be used as a sole basis for treatment. Nasal washings and aspirates are unacceptable for Xpert Xpress SARS-CoV-2/FLU/RSV testing.  Fact Sheet for Patients: EntrepreneurPulse.com.au  Fact Sheet for Healthcare Providers: IncredibleEmployment.be  This test is not yet approved or cleared by the [place] FDA and has been authorized for detection and/or diagnosis of SARS-CoV-2 by FDA under an Emergency Use Authorization (EUA). This EUA will remain in effect (meaning this test can be used) for  the duration of the COVID-19 declaration under Section 564(b)(1) of the Act, 21 U.S.C. section 360bbb-3(b)(1), unless the authorization is terminated or revoked.  Performed at [facility], [place] [address], [place], [place] [address]      Medications:    Continuous Infusions:  sodium chloride 75 mL/hr at 10/29/20 2226   ampicillin-sulbactam (UNASYN) IV 3 g (10/30/20 0330)      LOS: 1 day   Charlynne Cousins  [facility]  10/30/2020, 8:39 AM

## 2020-10-30 NOTE — Evaluation (Signed)
Clinical/Bedside Swallow Evaluation Patient Details  Name: Jodi Thompson MRN: [id number] Date of Birth: Mar 11, 1935  Today's Date: 10/30/2020 Time: SLP Start Time (ACUTE ONLY): [id number] SLP Stop Time (ACUTE ONLY): 0912 SLP Time Calculation (min) (ACUTE ONLY): 20 min  Past Medical History: History reviewed.  Past Surgical History: History reviewed.  HPI:  86 y.o. female who presented to the ED for evaluation of cough and possible aspiration.  Pt's son reported to ED physician that the pt lives with them and had an episode of vomiting, they were concerned that she may have aspirated.  They also reported that when pt tried to eat crackers she began choking.  Son reported that pt cannot swallow pills and is barely drinking any fluids.  Reported significant decline in cognitive function and refusal to eat and drink. CT chest (10/29/20) revealed Multifocal ground-glass and peribronchovascular airspace  opacities, consistent with (given clinical history) aspiration versus infection. PMH: advanced Alzheimer's dementia, paroxysmal A. fib not on anticoagulation, tachybrady syndrome status post PPM, history of SVT status post ablation, HTN, hyperlipidemia, depression, anxiety, GERD, mild persistent reactive airway disease, GI bleed with aspiration pneumonia (intubated and treated at [facility] 3/22).   Assessment / Plan / Recommendation Clinical Impression  Pt seen for bedside swallow evaluation this am initially lethargic, but able to be awakened with verbal greeting. Oral mechanism examination limited due to difficulty following commands, however during performance of oral care pt noted to be edentulous and have appropriate labial strength/ROM. Trials of ice chips, thin liquids via cup/straw and spoonfuls of puree revealed intermittent oral holding of bolus, but no overt s/sx of aspiration. Prolonged mastication and oral holding of regular textured solid evident and despite repeated liquid washes, oral residuals required manual  removal with finger sweep/toothbrush. Oral holding and residuals signficantly reduced with pureed solid trials. Called son to discuss pt's PLOF. He reports soft food/thin liquid diet at baseline with ocassional coughing on thin liquids and frequent prolonged mastication and oral holding of solids. Additionally, she required her pills crushed, but the son reports difficulty getting pt to take them despite this. Recommend dysphagia 1/thin liquid diet with meds crushed in puree. SLP to f/u for tolerance.  SLP Visit Diagnosis: Dysphagia, unspecified (R13.10)    Aspiration Risk  Mild aspiration risk    Diet Recommendation Dysphagia 1 (Puree);Thin liquid   Liquid Administration via: Cup;Straw Medication Administration: Crushed with puree Supervision: Full supervision/cueing for compensatory strategies;Staff to assist with self feeding Compensations: Minimize environmental distractions;Slow rate;Small sips/bites;Follow solids with liquid Postural Changes: Seated upright at 90 degrees    Other  Recommendations Oral Care Recommendations: Oral care BID;Staff/trained caregiver to provide oral care   Follow up Recommendations Other (comment) (TBD)      Frequency and Duration min 2x/week  2 weeks       Prognosis Prognosis for Safe Diet Advancement: Fair Barriers to Reach Goals: Cognitive deficits      Swallow Study   General Date of Onset: 10/29/20 HPI: 86 y.o. female who presented to the ED for evaluation of cough and possible aspiration.  Pt's son reported to ED physician that the pt lives with them and had an episode of vomiting, they were concerned that she may have aspirated.  They also reported that when pt tried to eat crackers she began choking.  Son reported that pt cannot swallow pills and is barely drinking any fluids.  Reported significant decline in cognitive function and refusal to eat and drink. CT chest (10/29/20) revealed Multifocal ground-glass and peribronchovascular airspace  opacities, consistent with (given clinical history) aspiration versus infection. PMH: advanced Alzheimer's dementia, paroxysmal A. fib not on anticoagulation, tachybrady syndrome status post PPM, history of SVT status post ablation, HTN, hyperlipidemia, depression, anxiety, GERD, mild persistent reactive airway disease, GI bleed with aspiration pneumonia (intubated and treated at [facility] 3/22). Type of Study: Bedside Swallow Evaluation Previous Swallow Assessment: Seen by SLP during previous hospitalization (at [facility]) per son. He reports that she was placed on thickened liquids. Diet Prior to this Study: Dysphagia 3 (soft);Thin liquids Temperature Spikes Noted: No Respiratory Status: Nasal cannula History of Recent Intubation: No Behavior/Cognition: Cooperative;Alert;Pleasant mood;Confused;Requires cueing Oral Cavity Assessment: Dried secretions Oral Care Completed by SLP: Yes Oral Cavity - Dentition: Edentulous Vision: Functional for self-feeding Self-Feeding Abilities: Able to feed self;Total assist Patient Positioning: Upright in bed;Postural control adequate for testing Baseline Vocal Quality: Normal    Oral/Motor/Sensory Function Overall Oral Motor/Sensory Function: Within functional limits   Ice Chips Ice chips: Within functional limits Presentation: Spoon   Thin Liquid Thin Liquid: Impaired Presentation: Cup;Straw Oral Phase Functional Implications: Oral holding    Nectar Thick Nectar Thick Liquid: Not tested   Honey Thick Honey Thick Liquid: Not tested   Puree Puree: Within functional limits Presentation: Spoon   Solid        Solid: Impaired Presentation: Self Fed Oral Phase Impairments: Impaired mastication Oral Phase Functional Implications: Oral holding;Oral residue     Jodi Dense, MA, [place] Office Number: [phone]  Jodi Thompson 10/30/2020,9:49 AM

## 2020-10-30 NOTE — Progress Notes (Signed)
New Admission Note:  Arrival Method: Pt arrived from ED around 0300 Mental Orientation: Pt is alert to self Telemetry: Box 8, CCMD notified Assessment: Completed Skin: Completed, refer to flowsheets IV: Right wrist Pain: Faces 0 Tubes: Purewick Safety Measures: Safety Fall Prevention Plan was given, discussed and signed. Admission: Completed [facility] Orientation: Patient has been orientated to the room, unit and the staff. Family: None  Orders have been reviewed and implemented. Will continue to monitor the patient. Call light has been placed within reach and bed alarm has been activated. Mats have been placed next to both sides of bed.   Perry Mount, RN  Phone Number: [phone]

## 2020-10-31 DIAGNOSIS — L8990 Pressure ulcer of unspecified site, unspecified stage: Secondary | ICD-10-CM | POA: Insufficient documentation

## 2020-10-31 DIAGNOSIS — R627 Adult failure to thrive: Secondary | ICD-10-CM

## 2020-10-31 DIAGNOSIS — F0280 Dementia in other diseases classified elsewhere without behavioral disturbance: Secondary | ICD-10-CM

## 2020-10-31 DIAGNOSIS — Z515 Encounter for palliative care: Secondary | ICD-10-CM

## 2020-10-31 NOTE — Progress Notes (Signed)
[facility] PROGRESS NOTE    Progress Note  Jodi Thompson  [id number] DOB: 05-07-1934 DOA: 10/29/2020 PCP: Cyndi Bender, PA-C     Brief Narrative:   Jodi Thompson is an 86 y.o. female past medical history significant for advanced Alzheimer's dementia, paroxysmal atrial fibrillation not on anticoagulation due to recent GI bleed, tachybradycardia syndrome status post pacemaker, history of SVT status post ablation, essential hypertension GI bleed, with aspiration pneumonia intubated at [facility] on March 2022 comes in via EMS for cough and possible aspiration, the son relates that she lives with them she had an episode of vomiting after she tried crackers and started choking sound ports the patient has difficulties pills and drinking fluid significant decline in cognitive function in the ED satting 89% improved with 2 L of oxygen with a white count of 16 CT of the chest showed multifocal groundglass opacity started on IV Unasyn   Assessment/Plan:   Acute Respiratory failure with hypoxia secondary to Aspiration pneumonia ([place]) Has remained afebrile leukocytosis improved. She is currently on IV Unasyn and Rocephin, noted to blood cultures growing  Gram variable  rods She seems to be responding to current treatment continue current management. Plan of care discussed with son he would like to continue IV antibiotics and fluids for now. She has a poor prognosis of the neck 6 months due to her risk of recurrent aspiration.  Dysphagia: Speech evaluated the patient mild risk of aspiration recommended dysphagia 1 diet.  Adrenal adenoma CT scan showed left adrenal adenoma, will need further work-up as an outpatient if family desires.  Advanced Alzheimer's dementia: Resume home meds, she is at high risk of aspiration and delirium. Started on melatonin at night, Seroquel as needed.  Paroxysmal atrial fibrillation:/Tachybradycardia syndrome Not on anticoagulation rate control.  Essential  hypertension: Resume home meds if she passes her swallowing evaluation.  Hyperlipidemia: Resume home meds.  Depression/anxiety: Resume home meds.   Stage 4 sacral decub ulcer present on admission: RN Pressure Injury Documentation: Pressure Injury 10/30/20 Sacrum Mid Stage 4 - Full thickness tissue loss with exposed bone, tendon or muscle. pt has 3 open places on sacrum, one is much larger than the other two (Active)  10/30/20 0300  Location: Sacrum  Location Orientation: Mid  Staging: Stage 4 - Full thickness tissue loss with exposed bone, tendon or muscle.  Wound Description (Comments): pt has 3 open places on sacrum, one is much larger than the other two  Present on Admission: Yes     DVT prophylaxis: lovenox Family Communication: Unable to reach son Status is: Inpatient  Remains inpatient appropriate because:Hemodynamically unstable  Dispo: The patient is from: Home              Anticipated d/c is to: Home              Patient currently is not medically stable to d/c.   Difficult to place patient No      Code Status:     Code Status Orders  (From admission, onward)           Start     Ordered   10/29/20 2111  Do not attempt resuscitation (DNR)  Continuous       Question Answer Comment  In the event of cardiac or respiratory ARREST Do not call a "code blue"   In the event of cardiac or respiratory ARREST Do not perform Intubation, CPR, defibrillation or ACLS   In the event of cardiac or respiratory ARREST Use medication by  any route, position, wound care, and other measures to relive pain and suffering. May use oxygen, suction and manual treatment of airway obstruction as needed for comfort.      10/29/20 2112           Code Status History     Date Active Date Inactive Code Status Order ID Comments User Context   10/29/2020 2042 10/29/2020 2112 DNR [id number]  Wyvonnia Dusky, MD ED         IV Access:   Peripheral IV   Procedures and  diagnostic studies:   CT Chest Wo Contrast  Result Date: 10/29/2020 CLINICAL DATA:  Cough. Concern for aspiration pneumonia. Dementia. COPD. EXAM: CT CHEST WITHOUT CONTRAST TECHNIQUE: Multidetector CT imaging of the chest was performed following the standard protocol without IV contrast. COMPARISON:  06/27/2020 chest radiograph. FINDINGS: Cardiovascular: Mild motion degradation throughout. Aortic atherosclerosis. Pacer. Lad coronary artery calcification. Mediastinum/Nodes: Mildly prominent, macrolobulated right lobe of the thyroid suggests multinodular goiter. No mediastinal or definite hilar adenopathy, given limitations of unenhanced CT. Lungs/Pleura: No pleural fluid.  Mild centrilobular emphysema. Within both lower lobes, inferior right middle lobe, minimally within the lingula, and within the right apex are areas of ground-glass and peribronchovascular airspace. Upper Abdomen: Multiple hepatic cysts. Cholecystectomy. Artifact degradation from patient right greater than left hand position. Normal imaged portions of the spleen, stomach, pancreas, left kidney. 1.8 cm left adrenal low-density nodule including on 146/5. A 1.2 cm right adrenal nodule has indeterminate density measurements but is statistically most likely an adenoma. Abdominal aortic atherosclerosis. Musculoskeletal: No acute osseous abnormality. IMPRESSION: 1. Multifactorial degradation, including positioning and motion. 2. Multifocal ground-glass and peribronchovascular airspace opacities, consistent with (given clinical history) aspiration versus infection. 3. Aortic atherosclerosis (ICD10-I70.0), coronary artery atherosclerosis and emphysema (ICD10-J43.9). 4. Left adrenal adenoma. Right adrenal nodule is technically indeterminate but most likely also an adenoma. Electronically Signed   By: Abigail Miyamoto M.D.   On: 10/29/2020 13:26     Medical Consultants:   None.   Subjective:    Jodi Thompson nonverbal  Objective:    Vitals:    10/30/20 2045 10/31/20 0117 10/31/20 0437 10/31/20 0747  BP: (!) 153/81 (!) 132/57 140/70 (!) 155/65  Pulse: 84 82 79 74  Resp: '18  17 18  '$ Temp: 99.8 F (37.7 C) 98.3 F (36.8 C) 98.4 F (36.9 C) 98.3 F (36.8 C)  TempSrc: Oral Oral Axillary Oral  SpO2: 100% 93% 96% 92%   SpO2: 92 % O2 Flow Rate (L/min): 2 L/min   Intake/Output Summary (Last 24 hours) at 10/31/2020 0929 Last data filed at 10/31/2020 [id number] Gross per 24 hour  Intake 340 ml  Output 700 ml  Net -360 ml    There were no vitals filed for this visit.  Exam: General exam: In no acute distress. Respiratory system: Good air movement and clear to auscultation. Cardiovascular system: S1 & S2 heard, RRR. No JVD. Gastrointestinal system: Abdomen is nondistended, soft and nontender.  Extremities: No pedal edema. Skin: No rashes, lesions or ulcers  Data Reviewed:    Labs: Basic Metabolic Panel: Recent Labs  Lab 10/29/20 1641  NA 140  K 3.6  CL 105  CO2 24  GLUCOSE 144*  BUN 12  CREATININE 1.03*  CALCIUM 9.2    GFR CrCl cannot be calculated (Unknown ideal weight.). Liver Function Tests: No results for input(s): AST, ALT, ALKPHOS, BILITOT, PROT, ALBUMIN in the last 168 hours. No results for input(s): LIPASE, AMYLASE in the last 168 hours. No  results for input(s): AMMONIA in the last 168 hours. Coagulation profile No results for input(s): INR, PROTIME in the last 168 hours. COVID-19 Labs  No results for input(s): DDIMER, FERRITIN, LDH, CRP in the last 72 hours.  Lab Results  Component Value Date   Hall NEGATIVE 10/29/2020    CBC: Recent Labs  Lab 10/29/20 1641 10/30/20 0400  WBC 16.9* 13.6*  NEUTROABS 15.6*  --   HGB 12.8 12.7  HCT 40.1 40.8  MCV 83.5 83.6  PLT 246 221    Cardiac Enzymes: No results for input(s): CKTOTAL, CKMB, CKMBINDEX, TROPONINI in the last 168 hours. BNP (last 3 results) No results for input(s): PROBNP in the last 8760 hours. CBG: No results for input(s):  GLUCAP in the last 168 hours. D-Dimer: No results for input(s): DDIMER in the last 72 hours. Hgb A1c: No results for input(s): HGBA1C in the last 72 hours. Lipid Profile: No results for input(s): CHOL, HDL, LDLCALC, TRIG, CHOLHDL, LDLDIRECT in the last 72 hours. Thyroid function studies: No results for input(s): TSH, T4TOTAL, T3FREE, THYROIDAB in the last 72 hours.  Invalid input(s): FREET3 Anemia work up: No results for input(s): VITAMINB12, FOLATE, FERRITIN, TIBC, IRON, RETICCTPCT in the last 72 hours. Sepsis Labs: Recent Labs  Lab 10/29/20 1641 10/30/20 0400  WBC 16.9* 13.6*    Microbiology Recent Results (from the past 240 hour(s))  Resp Panel by RT-PCR (Flu A&B, Covid) Nasopharyngeal Swab     Status: None   Collection Time: 10/29/20  6:02 PM   Specimen: Nasopharyngeal Swab; Nasopharyngeal(NP) swabs in vial transport medium  Result Value Ref Range Status   SARS Coronavirus 2 by RT PCR NEGATIVE NEGATIVE Final    Comment: (NOTE) SARS-CoV-2 target nucleic acids are NOT DETECTED.  The SARS-CoV-2 RNA is generally detectable in upper respiratory specimens during the acute phase of infection. The lowest concentration of SARS-CoV-2 viral copies this assay can detect is 138 copies/mL. A negative result does not preclude SARS-Cov-2 infection and should not be used as the sole basis for treatment or other patient management decisions. A negative result may occur with  improper specimen collection/handling, submission of specimen other than nasopharyngeal swab, presence of viral mutation(s) within the areas targeted by this assay, and inadequate number of viral copies(<138 copies/mL). A negative result must be combined with clinical observations, patient history, and epidemiological information. The expected result is Negative.  Fact Sheet for Patients:  EntrepreneurPulse.com.au  Fact Sheet for Healthcare Providers:   IncredibleEmployment.be  This test is no t yet approved or cleared by the [place] FDA and  has been authorized for detection and/or diagnosis of SARS-CoV-2 by FDA under an Emergency Use Authorization (EUA). This EUA will remain  in effect (meaning this test can be used) for the duration of the COVID-19 declaration under Section 564(b)(1) of the Act, 21 U.S.C.section 360bbb-3(b)(1), unless the authorization is terminated  or revoked sooner.       Influenza A by PCR NEGATIVE NEGATIVE Final   Influenza B by PCR NEGATIVE NEGATIVE Final    Comment: (NOTE) The Xpert Xpress SARS-CoV-2/FLU/RSV plus assay is intended as an aid in the diagnosis of influenza from Nasopharyngeal swab specimens and should not be used as a sole basis for treatment. Nasal washings and aspirates are unacceptable for Xpert Xpress SARS-CoV-2/FLU/RSV testing.  Fact Sheet for Patients: EntrepreneurPulse.com.au  Fact Sheet for Healthcare Providers: IncredibleEmployment.be  This test is not yet approved or cleared by the [place] FDA and has been authorized for detection and/or diagnosis  of SARS-CoV-2 by FDA under an Emergency Use Authorization (EUA). This EUA will remain in effect (meaning this test can be used) for the duration of the COVID-19 declaration under Section 564(b)(1) of the Act, 21 U.S.C. section 360bbb-3(b)(1), unless the authorization is terminated or revoked.  Performed at [facility], [place] [address], [place], [place] [address]   Culture, blood (routine x 2)     Status: None (Preliminary result)   Collection Time: 10/29/20  9:13 PM   Specimen: BLOOD RIGHT ARM  Result Value Ref Range Status   Specimen Description BLOOD RIGHT ARM  Final   Special Requests   Final    BOTTLES DRAWN AEROBIC AND ANAEROBIC Blood Culture adequate volume   Culture   Final    NO GROWTH < 12 HOURS Performed at [facility], [place] [address], [place], [place] [address]    Report Status PENDING  Incomplete  Culture, blood (routine x 2)     Status: Abnormal (Preliminary result)   Collection Time: 10/29/20  9:18 PM   Specimen: BLOOD LEFT ARM  Result Value Ref Range Status   Specimen Description BLOOD LEFT ARM  Final   Special Requests   Final    BOTTLES DRAWN AEROBIC AND ANAEROBIC Blood Culture adequate volume   Culture  Setup Time (A)  Final    GRAM VARIABLE ROD ANAEROBIC BOTTLE ONLY CRITICAL RESULT CALLED TO, READ BACK BY AND VERIFIED WITH: PHARMD J Iran [id number] AT 1406 BY CM Performed at [facility], [place] [address], [place], [place] [address]    Culture GRAM NEGATIVE RODS  Final   Report Status PENDING  Incomplete  Blood Culture ID Panel (Reflexed)     Status: Abnormal   Collection Time: 10/29/20  9:18 PM  Result Value Ref Range Status   Enterococcus faecalis NOT DETECTED NOT DETECTED Final   Enterococcus Faecium NOT DETECTED NOT DETECTED Final   Listeria monocytogenes NOT DETECTED NOT DETECTED Final   Staphylococcus species NOT DETECTED NOT DETECTED Final   Staphylococcus aureus (BCID) NOT DETECTED NOT DETECTED Final   Staphylococcus epidermidis NOT DETECTED NOT DETECTED Final   Staphylococcus lugdunensis NOT DETECTED NOT DETECTED Final   Streptococcus species NOT DETECTED NOT DETECTED Final   Streptococcus agalactiae NOT DETECTED NOT DETECTED Final   Streptococcus pneumoniae NOT DETECTED NOT DETECTED Final   Streptococcus pyogenes NOT DETECTED NOT DETECTED Final   A.calcoaceticus-baumannii NOT DETECTED NOT DETECTED Final   Bacteroides fragilis NOT DETECTED NOT DETECTED Final   Enterobacterales DETECTED (A) NOT DETECTED Final    Comment: Enterobacterales represent a large order of gram negative bacteria, not a single organism. CRITICAL RESULT CALLED TO, READ BACK BY AND VERIFIED WITH: PHARMD J FEANCE [id number] AT 1406 BY CM    Enterobacter cloacae complex NOT DETECTED NOT DETECTED Final   Escherichia coli NOT  DETECTED NOT DETECTED Final   Klebsiella aerogenes NOT DETECTED NOT DETECTED Final   Klebsiella oxytoca NOT DETECTED NOT DETECTED Final   Klebsiella pneumoniae DETECTED (A) NOT DETECTED Final    Comment: CRITICAL RESULT CALLED TO, READ BACK BY AND VERIFIED WITH: PHARMD J Iran [id number] AT 1406 BY CM    Proteus species NOT DETECTED NOT DETECTED Final   Salmonella species NOT DETECTED NOT DETECTED Final   Serratia marcescens NOT DETECTED NOT DETECTED Final   Haemophilus influenzae NOT DETECTED NOT DETECTED Final   Neisseria meningitidis NOT DETECTED NOT DETECTED Final   Pseudomonas aeruginosa NOT DETECTED NOT DETECTED Final   Stenotrophomonas  maltophilia NOT DETECTED NOT DETECTED Final   Candida albicans NOT DETECTED NOT DETECTED Final   Candida auris NOT DETECTED NOT DETECTED Final   Candida glabrata NOT DETECTED NOT DETECTED Final   Candida krusei NOT DETECTED NOT DETECTED Final   Candida parapsilosis NOT DETECTED NOT DETECTED Final   Candida tropicalis NOT DETECTED NOT DETECTED Final   Cryptococcus neoformans/gattii NOT DETECTED NOT DETECTED Final   CTX-M ESBL NOT DETECTED NOT DETECTED Final   Carbapenem resistance IMP NOT DETECTED NOT DETECTED Final   Carbapenem resistance KPC NOT DETECTED NOT DETECTED Final   Carbapenem resistance NDM NOT DETECTED NOT DETECTED Final   Carbapenem resist OXA 48 LIKE NOT DETECTED NOT DETECTED Final   Carbapenem resistance VIM NOT DETECTED NOT DETECTED Final    Comment: Performed at [facility], [address] [place], [place] [address]     Medications:    melatonin  3 mg Oral QHS   Continuous Infusions:  cefTRIAXone (ROCEPHIN)  IV 2 g (10/30/20 1758)      LOS: 2 days   Charlynne Cousins  [facility]  10/31/2020, 9:29 AM

## 2020-10-31 NOTE — Progress Notes (Signed)
  Speech Language Pathology Treatment: Dysphagia  Patient Details Name: Jodi Thompson MRN: [id number] DOB: [birth year] Today's Date: 10/31/2020 Time: 9528-4132 SLP Time Calculation (min) (ACUTE ONLY): 11 min  Assessment / Plan / Recommendation Clinical Impression  Pt asleep, reclined in bed with cup of water in hand upon SLP arrival. Repositioned pt upright and awakened with verbal greeting. Oral defensiveness prevalent this date with pt only consuming x2 bites of puree, x1 bite of simulated dysphagia 2 (ground) solid and x3 sips of thin liquids via straw. She refused further consumption and RN reports that this has been a pattern over the past day or so. No overt s/sx of aspiration noted across trials, however simulated dysphagia 2 solid was significant for prolonged bolus formation and this clinician was unable to determine full oral clearance due to oral defensiveness. Liquid washes and attempted clearance with toothbrush performed prior to termination of session. Pt's cognition appears to primarily impact swallow function and intake. Recommend pt continue on dysphagia 1/thin liquid diet at this time. No further ST f/u warranted and will s/o. Reconsult if pt's status improves for possible diet advancement.    HPI HPI: 86 y.o. female who presented to the ED for evaluation of cough and possible aspiration.  Pt's son reported to ED physician that the pt lives with them and had an episode of vomiting, they were concerned that she may have aspirated.  They also reported that when pt tried to eat crackers she began choking.  Son reported that pt cannot swallow pills and is barely drinking any fluids.  Reported significant decline in cognitive function and refusal to eat and drink. CT chest (10/29/20) revealed Multifocal ground-glass and peribronchovascular airspace  opacities, consistent with (given clinical history) aspiration versus infection. PMH: advanced Alzheimer's dementia, paroxysmal A. fib not on  anticoagulation, tachybrady syndrome status post PPM, history of SVT status post ablation, HTN, hyperlipidemia, depression, anxiety, GERD, mild persistent reactive airway disease, GI bleed with aspiration pneumonia (intubated and treated at [facility] 3/22).      SLP Plan  Discharge SLP treatment due to (comment) (goals met and least restrictive diet achieved at this time.)       Recommendations  Diet recommendations: Dysphagia 1 (puree);Thin liquid Liquids provided via: Cup;Straw Medication Administration: Crushed with puree Supervision: Full supervision/cueing for compensatory strategies;Staff to assist with self feeding Compensations: Minimize environmental distractions;Slow rate;Small sips/bites;Follow solids with liquid Postural Changes and/or Swallow Maneuvers: Seated upright 90 degrees                Oral Care Recommendations: Oral care BID;Staff/trained caregiver to provide oral care Follow up Recommendations: 24 hour supervision/assistance SLP Visit Diagnosis: Dysphagia, unspecified (R13.10) Plan: Discharge SLP treatment due to (comment) (goals met and least restrictive diet achieved at this time.)       [place], [place], [place] Office Number: [phone]  Acie Fredrickson 10/31/2020, 10:08 AM

## 2020-10-31 NOTE — Progress Notes (Signed)
Daily Progress Note   Patient Name: Jodi Thompson       Date: 11/01/2020 DOB: 07-31-1934  Age: 86 y.o. MRN#: [id number] Attending Physician: Nita Sells, MD Primary Care Physician: Cyndi Bender, PA-C Admit Date: 10/29/2020  Reason for Consultation/Follow-up: Establishing goals of care, Non pain symptom management, and Psychosocial/spiritual support  Subjective:  Pt is stable since seen yesterday. She is sitting in bed in NAD but is not oriented to self, place, time, or situation.   Physical Exam HENT:     Head: Normocephalic and atraumatic.  Cardiovascular:     Rate and Rhythm: Regular rhythm.  Pulmonary:     Effort: Pulmonary effort is normal.  Abdominal:     Palpations: Abdomen is soft.  Musculoskeletal:        General: No swelling or deformity.     Right lower leg: No edema.     Left lower leg: No edema.  Neurological:     Mental Status: She is alert. Mental status is at baseline.  Psychiatric:        Mood and Affect: Mood normal.        Behavior: Behavior normal.         Palliative Care Assessment & Plan   HPI: Jodi Thompson is a 86 y.o. female with medical history significant of advanced Alzheimer's dementia, paroxysmal Afib (not on anticoagulation due to history of recent GI bleed), tachybrady syndrome status post PPM, history of SVT status post ablation, hypertension, hyperlipidemia, depression, anxiety, GERD, mild persistent reactive airway disease, GI bleed with aspiration pneumonia (intubated and treated at [facility] 3/22).     On 10/29/20, Jodi Thompson met with patients son, Jodi Thompson, and conference called patients daughter, Jodi Thompson. Reviewed the patients downward trajectory over the past few months. Per note review family now accepting of hospice though they are deliberating over  which hospice agency to choose.   Presently, Jodi Thompson is being treated with IV ABX and mIVF for aspiration pneumonia in the setting of dysphagia from advanced dementia.   Assessment:  I reviewed medical records, received report from team, assessed the patient and then met at the patient's bedside with patient's son Jodi Thompson to discuss EOL wishes disposition and options.  Values and goals of care important to patient and family were attempted to be elicited.  Concepts of Hospice and Palliative Care were again reviewed. Educated patient's son on natural trajectory and expectations at EOL.  Questions and concerns addressed. Emotional support was provided. Family encouraged to call with questions or concerns.   Son Jodi Thompson is still at bedside and is not prepared to make a decision regarding home with hospice or IPU. I attempted to again review the difference between [facility] and home with Hospice but Jodi Thompson was not ready to discuss disposition today. He again mentioned that he would likely make a decision on Wednesday or Thursday of this week.   Questions and concerns addressed.  Patient's son Jodi Thompson encouraged to call with questions or concerns.     PMT will continue to support holistically.  Recommendations/Plan: Problem: End stage dementia with disposition decisions that need to be made Diagnosis: End stage, terminal illness - focus on comfort care and avoid aggressive treatment options  Code Status:  DNR  Prognosis:  Unable to determine   Discharge Planning: To Be Determined - Hospice IPU or Home with Hospice  Care plan was discussed with patient and patient's son  Vital Signs: BP (!) 169/75 (BP Location: Left Arm)   Pulse 88   Temp 99.2 F (37.3 C) (Oral)   Resp 14   SpO2 93%  SpO2: SpO2: 93 % O2 Device: O2 Device: Room Air O2 Flow Rate: O2 Flow Rate (L/min): 2 L/min      Palliative Assessment/Data: 20%    Thank you for allowing the Palliative Medicine Team to assist in the care of  this patient.   Total Time 20 Prolonged Time Billed  no      Greater than 50% of the time was spent in counseling and coordination of care.    [name] NP [phone]

## 2020-11-01 LAB — CULTURE, BLOOD (ROUTINE X 2): Special Requests: ADEQUATE

## 2020-11-01 LAB — BASIC METABOLIC PANEL
Anion gap: 11 (ref 5–15)
BUN: 17 mg/dL (ref 8–23)
CO2: 26 mmol/L (ref 22–32)
Calcium: 9 mg/dL (ref 8.9–10.3)
Chloride: 106 mmol/L (ref 98–111)
Creatinine, Ser: 0.72 mg/dL (ref 0.44–1.00)
GFR, Estimated: >60 mL/min (ref >60)
Glucose, Bld: 136 mg/dL — ABNORMAL HIGH (ref 70–99)
Potassium: 3.2 mmol/L — ABNORMAL LOW (ref 3.5–5.1)
Sodium: 143 mmol/L (ref 135–145)

## 2020-11-01 LAB — MAGNESIUM: Magnesium: 2 mg/dL (ref 1.7–2.4)

## 2020-11-01 MED ORDER — TRAZODONE HCL 50 MG PO TABS
50.0000 mg | ORAL_TABLET | Freq: Every day | ORAL | Status: DC
  Administered 2020-11-01: 50 mg via ORAL
  Filled 2020-11-01: qty 1

## 2020-11-01 MED ORDER — MEMANTINE HCL 5 MG PO TABS: 5.0000 mg | ORAL_TABLET | Freq: Two times a day (BID) | ORAL | Status: DC

## 2020-11-01 MED ORDER — MEMANTINE HCL 10 MG PO TABS
10.0000 mg | ORAL_TABLET | Freq: Two times a day (BID) | ORAL | Status: DC
  Administered 2020-11-01 – 2020-11-02 (×2): 10 mg via ORAL
  Filled 2020-11-01 (×3): qty 1

## 2020-11-01 MED ORDER — METOPROLOL TARTRATE 5 MG/5ML IV SOLN
2.5000 mg | Freq: Four times a day (QID) | INTRAVENOUS | Status: DC
  Administered 2020-11-01 – 2020-11-02 (×6): 2.5 mg via INTRAVENOUS
  Filled 2020-11-01 (×6): qty 5

## 2020-11-01 MED ORDER — POTASSIUM CHLORIDE 20 MEQ PO PACK
40.0000 meq | PACK | Freq: Two times a day (BID) | ORAL | Status: DC
  Administered 2020-11-01 – 2020-11-02 (×2): 40 meq via ORAL
  Filled 2020-11-01 (×2): qty 2

## 2020-11-01 MED ORDER — SERTRALINE HCL 50 MG PO TABS
150.0000 mg | ORAL_TABLET | Freq: Every day | ORAL | Status: DC
  Administered 2020-11-01 – 2020-11-02 (×2): 150 mg via ORAL
  Filled 2020-11-01 (×2): qty 1

## 2020-11-01 MED ORDER — METOPROLOL TARTRATE 5 MG/5ML IV SOLN
2.5000 mg | Freq: Once | INTRAVENOUS | Status: AC
  Administered 2020-11-01: 2.5 mg via INTRAVENOUS
  Filled 2020-11-01: qty 5

## 2020-11-01 NOTE — TOC Initial Note (Addendum)
Transition of Care [facility]) - Initial/Assessment Note    Patient Details  Name: Jodi Thompson MRN: [id number] Date of Birth: 17-Feb-1935  Transition of Care [facility]) CM/SW Contact:    Jodi Favre, RN Phone Number: 11/01/2020, 4:28 PM  Clinical Narrative:                 Spoke to patient's son Jodi Thompson [phone] regarding disposition plan.   Patient's grand daughter ( Jodi Thompson's son) helps take care of Jodi Thompson at home. Currently grand daughter has covid. Jodi Thompson has called the vice president of [place] ( Jodi Thompson) and her her if it is possible that Jodi Thompson could go to their hospice home for a few days until her grand daughter "gets over covid". Per Jodi Thompson stated it might be possible and they will come assess Jodi Thompson in the hospital.   NCM called [place] referral line and left message. Awaiting call back. Jodi Thompson with [facility] returned call, and will review referral and speak with patient and Jodi Thompson . Await determination.  Expected Discharge Plan: [place]     Patient Goals and CMS Choice     Choice offered to / list presented to : Adult Children Jodi Thompson [id number])  Expected Discharge Plan and Services Expected Discharge Plan: [place]   Discharge Planning Services: CM Consult                       DME Agency: NA       HH Arranged: NA          Prior Living Arrangements/Services   Lives with:: Adult Children Patient language and need for interpreter reviewed:: Yes              Criminal Activity/Legal Involvement Pertinent to Current Situation/Hospitalization: No - Comment as needed  Activities of Daily Living      Permission Sought/Granted                  Emotional Assessment              Admission diagnosis:  Aspiration pneumonia (Jodi Thompson) [J69.0] Aspiration pneumonia, unspecified aspiration pneumonia type, unspecified laterality, unspecified part of lung (Jodi Thompson)  [J69.0] Patient Active Problem List   Diagnosis Date Noted   Pressure injury of skin 10/31/2020   Aspiration pneumonia (Jodi Thompson) 10/29/2020   Acute hypoxemic respiratory failure (Jodi Thompson) 10/29/2020   Dysphagia 10/29/2020   Adrenal adenoma 10/29/2020   Alzheimer's dementia (Jodi Thompson) 10/29/2020   PCP:  Cyndi Bender, PA-C Pharmacy:   [facility] [place], [place] [address] Phone: [phone] Fax: [phone]     Social Determinants of Health (SDOH) Interventions    Readmission Risk Interventions No flowsheet data found.

## 2020-11-01 NOTE — Care Management Important Message (Signed)
Important Message  Patient Details  Name: Jodi Thompson MRN: [id number] Date of Birth: December 22, 1934   Medicare Important Message Given:  Yes     [name] 11/01/2020, 2:27 PM

## 2020-11-01 NOTE — Progress Notes (Signed)
[facility] PROGRESS NOTE    Progress Note  Jodi Thompson  [id number] DOB: January 06, 1935 DOA: 10/29/2020 PCP: Cyndi Bender, PA-C     Brief Narrative:  86 year old white female Alzheimer's dementia, Paroxysmal atrial fibrillation not on anticoagulation due to recent GI bleed, tachybradycardia syndrome status post pacemaker,  history of SVT status post ablation,  essential hypertension GI bleed, with aspiration pneumonia intubated at [facility] on March 2022 admit with possible aspiration,   Patient's son reported significant decline recently and terms of functional status  in the ED satting 89% improved with 2 L of oxygen with a white count of 16 CT of the chest showed multifocal groundglass opacity started on IV Unasyn   Assessment/Plan:   Acute Respiratory failure with hypoxia secondary to Aspiration pneumonia ([place]) Has remained afebrile leukocytosis improved. Antibiotics narrowed to ceftriaxone every 24--we will transition to p.o. antibiotics tomorrow 8/4 to complete a total of 7 days no further lab work needed She has a poor prognosis of the neck 6 months due to her risk of recurrent aspiration. Patient son agreeable poor prognosis after palliative discussions-currently awaiting disposition discussion  Dysphagia: Speech evaluated the patient mild risk of aspiration recommended dysphagia 1 diet  Which will not prevent her from aspirating  Paroxysmal A. fib overnight 8/2 No recurrence since then-we will place on metoprolol 2.5 every 6 hourly until discharge then discontinue  Adrenal adenoma Left adrenal adenoma will not be worked up further  Advanced Alzheimer's dementia: high risk of aspiration and delirium. Started on melatonin at night, resume sertraline 150 daily trazodone 50 at bedtime can use Namenda 10 mg daily  Paroxysmal atrial fibrillation:/Tachybradycardia syndrome Not on anticoagulation rate control.  Depression/anxiety: Resume home meds.  Stop all  nonessential antihypertensives and lipid meds  Stage 4 sacral decub ulcer present on admission: RN Pressure Injury Documentation: Pressure Injury 10/30/20 Sacrum Mid Stage 4 - Full thickness tissue loss with exposed bone, tendon or muscle. pt has 3 open places on sacrum, one is much larger than the other two (Active)  10/30/20 0300  Location: Sacrum  Location Orientation: Mid  Staging: Stage 4 - Full thickness tissue loss with exposed bone, tendon or muscle.  Wound Description (Comments): pt has 3 open places on sacrum, one is much larger than the other two  Present on Admission: Yes     DVT prophylaxis: lovenox Family Communication: Long discussion with son on the phone 8/3-have told him that we need to make a plan regarding disposition next 24 hours-he is aware Status is: Inpatient  Remains inpatient appropriate because:Hemodynamically unstable  Dispo: The patient is from: Home              Anticipated d/c is to: Home              Patient currently is not medically stable to d/c.   Difficult to place patient No      Code Status:     Code Status Orders  (From admission, onward)           Start     Ordered   10/29/20 2111  Do not attempt resuscitation (DNR)  Continuous       Question Answer Comment  In the event of cardiac or respiratory ARREST Do not call a "code blue"   In the event of cardiac or respiratory ARREST Do not perform Intubation, CPR, defibrillation or ACLS   In the event of cardiac or respiratory ARREST Use medication by any route, position, wound care, and other measures  to relive pain and suffering. May use oxygen, suction and manual treatment of airway obstruction as needed for comfort.      10/29/20 2112           Code Status History     Date Active Date Inactive Code Status Order ID Comments User Context   10/29/2020 2042 10/29/2020 2112 DNR [id number]  Wyvonnia Dusky, MD ED         IV Access:   Peripheral IV   Procedures and  diagnostic studies:   No results found.   Medical Consultants:   None.   Subjective:   Nonverbal at times pleasantly confused otherwise in the process of getting shower No fever no chills not in distress  Objective:    Vitals:   11/01/20 0335 11/01/20 0540 11/01/20 0800 11/01/20 1141  BP: (!) 136/92 (!) 157/68 (!) 169/75 (!) 151/79  Pulse: (!) 148 90 88 80  Resp: '16 17 14 18  '$ Temp: 97.7 F (36.5 C) 97.9 F (36.6 C) 99.2 F (37.3 C) 97.9 F (36.6 C)  TempSrc: Oral Oral Oral Oral  SpO2: 93% 92% 93% 90%   SpO2: 90 % O2 Flow Rate (L/min): 2 L/min   Intake/Output Summary (Last 24 hours) at 11/01/2020 1700 Last data filed at 11/01/2020 [id number] Gross per 24 hour  Intake --  Output 675 ml  Net -675 ml    There were no vitals filed for this visit.  Exam: Awake coherent no distress Chest clear no rales rhonchi Sacral decubitus reviewed Abdomen soft no rebound no guarding-quite frail  Data Reviewed:    Labs: Basic Metabolic Panel: Recent Labs  Lab 10/29/20 1641 11/01/20 0910  NA 140 143  K 3.6 3.2*  CL 105 106  CO2 24 26  GLUCOSE 144* 136*  BUN 12 17  CREATININE 1.03* 0.72  CALCIUM 9.2 9.0  MG  --  2.0    GFR CrCl cannot be calculated (Unknown ideal weight.). Liver Function Tests: No results for input(s): AST, ALT, ALKPHOS, BILITOT, PROT, ALBUMIN in the last 168 hours. No results for input(s): LIPASE, AMYLASE in the last 168 hours. No results for input(s): AMMONIA in the last 168 hours. Coagulation profile No results for input(s): INR, PROTIME in the last 168 hours. COVID-19 Labs  No results for input(s): DDIMER, FERRITIN, LDH, CRP in the last 72 hours.  Lab Results  Component Value Date   [place] NEGATIVE 10/29/2020    CBC: Recent Labs  Lab 10/29/20 1641 10/30/20 0400  WBC 16.9* 13.6*  NEUTROABS 15.6*  --   HGB 12.8 12.7  HCT 40.1 40.8  MCV 83.5 83.6  PLT 246 221    Cardiac Enzymes: No results for input(s): CKTOTAL, CKMB,  CKMBINDEX, TROPONINI in the last 168 hours. BNP (last 3 results) No results for input(s): PROBNP in the last 8760 hours. CBG: No results for input(s): GLUCAP in the last 168 hours. D-Dimer: No results for input(s): DDIMER in the last 72 hours. Hgb A1c: No results for input(s): HGBA1C in the last 72 hours. Lipid Profile: No results for input(s): CHOL, HDL, LDLCALC, TRIG, CHOLHDL, LDLDIRECT in the last 72 hours. Thyroid function studies: No results for input(s): TSH, T4TOTAL, T3FREE, THYROIDAB in the last 72 hours.  Invalid input(s): FREET3 Anemia work up: No results for input(s): VITAMINB12, FOLATE, FERRITIN, TIBC, IRON, RETICCTPCT in the last 72 hours. Sepsis Labs: Recent Labs  Lab 10/29/20 1641 10/30/20 0400  WBC 16.9* 13.6*    Microbiology    Medications:  melatonin  3 mg Oral QHS   memantine  10 mg Oral BID   metoprolol tartrate  2.5 mg Intravenous Q6H   potassium chloride  40 mEq Oral BID   sertraline  150 mg Oral Daily   traZODone  50 mg Oral QHS   Continuous Infusions:  cefTRIAXone (ROCEPHIN)  IV 2 g (11/01/20 1557)      LOS: 3 days   Jai-Gurmukh Guynell Kleiber  [facility]  11/01/2020, 5:00 PM

## 2020-11-01 NOTE — Progress Notes (Signed)
   11/01/20 0335  Assess: MEWS Score  Temp 97.7 F (36.5 C)  BP (!) 136/92  Pulse Rate (!) 148  Resp 16  SpO2 93 %  O2 Device Room Air  Assess: MEWS Score  MEWS Temp 0  MEWS Systolic 0  MEWS Pulse 3  MEWS RR 0  MEWS LOC 0  MEWS Score 3  MEWS Score Color Yellow  Assess: if the MEWS score is Yellow or Red  Were vital signs taken at a resting state? Yes  Focused Assessment Change from prior assessment (see assessment flowsheet)  Early Detection of Sepsis Score *See Row Information* Medium  MEWS guidelines implemented *See Row Information* Yes  Treat  MEWS Interventions Administered prn meds/treatments  Pain Scale PAINAD  Pain Score 0  Breathing 0  Negative Vocalization 0  Facial Expression 0  Body Language 0  Consolability 0  PAINAD Score 0  Take Vital Signs  Increase Vital Sign Frequency  Yellow: Q 2hr X 2 then Q 4hr X 2, if remains yellow, continue Q 4hrs  Escalate  MEWS: Escalate Yellow: discuss with charge nurse/RN and consider discussing with provider and RRT  Notify: Charge Nurse/RN  Name of Charge Nurse/RN Notified Adline Peals, RN  Date Charge Nurse/RN Notified 11/01/20  Time Charge Nurse/RN Notified 0330  Notify: Provider  Provider Name/Title Gershon Cull, NP  Date Provider Notified 11/01/20  Time Provider Notified [phone]  Notification Type Page  Notification Reason Change in status  Provider response See new orders  Date of Provider Response 11/01/20  Time of Provider Response 0305   Pt on cardiac monitor, previously on SR. CCMD notified this RN pt converted to Afib. Assessed pt, pt is resting comfortably on bed. VS taken. HR sustaining 120's-140's. Notified provider on call. EKG obtained and showed Afib with RVR with the rate pf 136. 2.5 mg of metoprolol x1 has been ordered and given. Yellow MEWs guidelines implemented.

## 2020-11-02 LAB — COMPREHENSIVE METABOLIC PANEL
ALT: 19 U/L (ref 0–44)
AST: 31 U/L (ref 15–41)
Albumin: 2.7 g/dL — ABNORMAL LOW (ref 3.5–5.0)
Alkaline Phosphatase: 55 U/L (ref 38–126)
Anion gap: 10 (ref 5–15)
BUN: 20 mg/dL (ref 8–23)
CO2: 26 mmol/L (ref 22–32)
Calcium: 8.8 mg/dL — ABNORMAL LOW (ref 8.9–10.3)
Chloride: 106 mmol/L (ref 98–111)
Creatinine, Ser: 0.66 mg/dL (ref 0.44–1.00)
GFR, Estimated: >60 mL/min (ref >60)
Glucose, Bld: 126 mg/dL — ABNORMAL HIGH (ref 70–99)
Potassium: 2.8 mmol/L — ABNORMAL LOW (ref 3.5–5.1)
Sodium: 142 mmol/L (ref 135–145)
Total Bilirubin: 0.5 mg/dL (ref 0.3–1.2)
Total Protein: 5.5 g/dL — ABNORMAL LOW (ref 6.5–8.1)

## 2020-11-02 LAB — CBC WITH DIFFERENTIAL/PLATELET
Abs Immature Granulocytes: 0.08 10*3/uL — ABNORMAL HIGH (ref 0.00–0.07)
Basophils Absolute: 0 10*3/uL (ref 0.0–0.1)
Basophils Relative: 0 %
Eosinophils Absolute: 0 10*3/uL (ref 0.0–0.5)
Eosinophils Relative: 0 %
HCT: 37.3 % (ref 36.0–46.0)
Hemoglobin: 12.1 g/dL (ref 12.0–15.0)
Immature Granulocytes: 1 %
Lymphocytes Relative: 5 %
Lymphs Abs: 0.5 10*3/uL — ABNORMAL LOW (ref 0.7–4.0)
MCH: 26.5 pg (ref 26.0–34.0)
MCHC: 32.4 g/dL (ref 30.0–36.0)
MCV: 81.6 fL (ref 80.0–100.0)
Monocytes Absolute: 1.2 10*3/uL — ABNORMAL HIGH (ref 0.1–1.0)
Monocytes Relative: 12 %
Neutro Abs: 8 10*3/uL — ABNORMAL HIGH (ref 1.7–7.7)
Neutrophils Relative %: 82 %
Platelets: 98 10*3/uL — ABNORMAL LOW (ref 150–400)
RBC: 4.57 MIL/uL (ref 3.87–5.11)
RDW: 17.8 % — ABNORMAL HIGH (ref 11.5–15.5)
WBC: 9.8 10*3/uL (ref 4.0–10.5)
nRBC: 0 % (ref 0.0–0.2)

## 2020-11-02 MED ORDER — LEVOFLOXACIN 500 MG PO TABS: 500.0000 mg | ORAL_TABLET | Freq: Every day | ORAL | 0 refills | Status: AC

## 2020-11-02 MED ORDER — MEMANTINE HCL 10 MG PO TABS: 10.0000 mg | ORAL_TABLET | Freq: Two times a day (BID) | ORAL | 0 refills | Status: AC

## 2020-11-02 MED ORDER — TRAZODONE HCL 50 MG PO TABS: 50.0000 mg | ORAL_TABLET | Freq: Every day | ORAL | 0 refills | Status: AC

## 2020-11-02 MED ORDER — SERTRALINE HCL 100 MG PO TABS: 150.0000 mg | ORAL_TABLET | Freq: Every day | ORAL | 0 refills | Status: AC

## 2020-11-02 MED ORDER — QUETIAPINE FUMARATE 25 MG PO TABS: 25.0000 mg | ORAL_TABLET | Freq: Every evening | ORAL | 0 refills | Status: AC | PRN

## 2020-11-02 NOTE — Progress Notes (Signed)
[facility] person came and said; they will not be able to accept the pt

## 2020-11-02 NOTE — Plan of Care (Signed)

## 2020-11-02 NOTE — Progress Notes (Signed)
Palliative Care Progress Note, Assessment & Plan   Patient Name: Jodi Thompson       Date: 11/02/2020 DOB: 07-13-1934  Age: 86 y.o. MRN#: [id number] Attending Physician: Nita Sells, MD Primary Care Physician: Cyndi Bender, PA-C Admit Date: 10/29/2020  Reason for Consultation/Follow-up: Disposition  Subjective: Patient is resting comfortably in bed in NAD. She smiles and says hello when I enter the room but cannot follow a linear conversation beyond this.   HPI: Jodi Thompson is a 86 y.o. female with medical history significant of advanced Alzheimer's dementia, paroxysmal Afib (not on anticoagulation due to history of recent GI bleed), tachybrady syndrome status post PPM, history of SVT status post ablation, hypertension, hyperlipidemia, depression, anxiety, GERD, mild persistent reactive airway disease, GI bleed with aspiration pneumonia (intubated and treated at [facility] 3/22).     Presently, Jodi Thompson is being treated with IV ABX and mIVF for aspiration pneumonia in the setting of dysphagia from advanced dementia.  Family face treatment option decisions, advanced directive decisions, and anticipatory care needs    Assessment:  Currently, Jodi Thompson, patient's son, is vascillating between home with hospice or [facility]. He continues to say he is not sure he can manage taking care of her at home since his daughter, one of the primary care givers, just tested positive for Covid. He mentioned asking the VP of Hospice for respite care until his daughter tests negative.  I educated him that since the patient is sitting up, eating small bites and drinking sips of water that she might not qualify for IPU. However, home with hospice is certainly a viable option to provide comfort, dignity, and symptom management for the  patient. The son is not willing to make a decision about stopping IV antibiotics and asked if she could go to [place] temporarily to finish the IV antibiotics, give his family respite, and then move her home with hospice.   Recommendations/Plan: Patient's son needs to decide if home with hospice is an option since patient seems stable for discharge  Care plan was discussed with patient's son and decision maker [name]  Length of Stay: 4  Physical Exam HENT:     Head: Normocephalic.  Cardiovascular:     Rate and Rhythm: Rhythm irregular.  Pulmonary:     Effort: Pulmonary effort is normal.  Abdominal:     Palpations: Abdomen is soft.  Musculoskeletal:     Right lower leg: No edema.     Left lower leg: No edema.  Skin:    General: Skin is warm and dry.  Neurological:     Mental Status: She is alert. Mental status is at baseline.  Psychiatric:        Behavior: Behavior normal.            Vital Signs: BP (!) 150/72 (BP Location: Left Arm)   Pulse 79   Temp 98.4 F (36.9 C) (Oral)   Resp 17   SpO2 95%  SpO2: SpO2: 95 % O2 Device: O2 Device: Room Air O2 Flow Rate: O2 Flow Rate (L/min): 2 L/min      Palliative Assessment/Data: 20%    Greater than 50 % of the time was spent in counseling and coordination of  care  Code Status: DNR  Prognosis:  Weeks to months, will depend on desire for life prolonging measures  Discharge Planning: To Be Determined  Thank you for allowing the Palliative Medicine Team to assist in the care of this patient.   Total Time 25 minutes Prolonged Time Billed  no

## 2020-11-02 NOTE — Discharge Summary (Signed)
Physician Discharge Summary  Jodi Thompson [id number] DOB: [birth year] DOA: 10/29/2020  PCP: Cyndi Bender, PA-C  Admit date: 10/29/2020 Discharge date: 11/02/2020  Time spent: 26 minutes  Recommendations for Outpatient Follow-up:  Patient discharging to freestanding hospice for end-of-life care  Discharge Diagnoses:  MAIN problem for hospitalization   Recurrent aspiration pneumonia Moderate to severe Alzheimer's dementia  Please see below for itemized issues addressed in HOpsital- refer to other progress notes for clarity if needed  Discharge Condition: Overall guarded  Diet recommendation: Comfort  There were no vitals filed for this visit.  History of present illness:   86 year old white female Alzheimer's dementia, Paroxysmal atrial fibrillation not on anticoagulation due to recent GI bleed, tachybradycardia syndrome status post pacemaker, history of SVT status post ablation,  essential hypertension GI bleed, with aspiration pneumonia intubated at [facility] on March 2022 admit with possible aspiration,   Patient's son reported significant decline recently and terms of functional status  in the ED satting 89% improved with 2 L of oxygen with a white count of 16 CT of the chest showed multifocal groundglass opacity started on IV Unasyn  Hospital Course:  Respiratory failure with hypoxia secondary to Aspiration pneumonia ([place]) Has remained afebrile leukocytosis improved. Antibiotics narrowed to ceftriaxone every 24--we will transition to p.o. antibiotics tomorrow 8/4 to complete a total of 7 days no further lab work needed She has a poor prognosis less than 6 months due to her risk of recurrent aspiration. Patient son agreeable poor prognosis after palliative discussions-currently awaiting freestanding hospice  Dysphagia: Speech evaluated the patient mild risk of aspiration recommended dysphagia 1 diet  Which will not prevent her from aspirating   Paroxysmal A. fib overnight 8/2  treated with IV metoprolol No recurrence since then-transition to metoprolol 25 twice daily on discharge   Adrenal adenoma Left adrenal adenoma will not be worked up further  Advanced Alzheimer's dementia: high risk of aspiration and delirium. resume sertraline 150 daily trazodone 50 at bedtime-see other med changes can use Namenda 10 mg daily  Paroxysmal atrial fibrillation:/Tachybradycardia syndrome Not on anticoagulation  See above  Depression/anxiety: Resume home meds.   Stop all nonessential antihypertensives and lipid meds   Stage 4 sacral decub ulcer present on admission: RN Pressure Injury Documentation: Pressure Injury 10/30/20 Sacrum Mid Stage 4 - Full thickness tissue loss with exposed bone, tendon or muscle. pt has 3 open places on sacrum, one is much larger than the other two (Active)  10/30/20 0300  Location: Sacrum  Location Orientation: Mid  Staging: Stage 4 - Full thickness tissue loss with exposed bone, tendon or muscle.  Wound Description (Comments): pt has 3 open places on sacrum, one is much larger than the other two  Present on Admission: Yes   Do not expect the pressure wound will heal without patient turning   Discharge Exam: Vitals:   11/02/20 0400 11/02/20 0812  BP: (!) 150/72   Pulse: 77 79  Resp: 17 17  Temp: 98.7 F (37.1 C) 98.4 F (36.9 C)  SpO2: 96% 95%    Subj on day of d/c   Confused  General Exam on discharge  Alert coherent no distress x1, no distress pleasantly confused Frail cachectic white female no distress Chest clear Abdomen soft Wound not examined today  Discharge Instructions   Discharge Instructions     Diet - low sodium heart healthy   Complete by: As directed    Increase activity slowly   Complete by: As directed    No wound  care   Complete by: As directed       Allergies as of 11/02/2020       Reactions   Tessalon [benzonatate] Shortness Of Breath   Atorvastatin         Medication List      STOP taking these medications    albuterol 108 (90 Base) MCG/ACT inhaler Commonly known as: VENTOLIN HFA   aspirin EC 81 MG tablet   cetirizine 10 MG tablet Commonly known as: ZYRTEC   cholecalciferol 25 MCG (1000 UNIT) tablet Commonly known as: VITAMIN D3   metroNIDAZOLE 500 MG tablet Commonly known as: FLAGYL   pantoprazole 40 MG tablet Commonly known as: PROTONIX   spironolactone 25 MG tablet Commonly known as: ALDACTONE       TAKE these medications    budesonide 0.5 MG/2ML nebulizer solution Commonly known as: PULMICORT Take 0.5 mg by nebulization 2 (two) times daily.   ipratropium-albuterol 0.5-2.5 (3) MG/3ML Soln Commonly known as: DUONEB Take 3 mLs by nebulization every 6 (six) hours as needed (wheezing / sob).   memantine 10 MG tablet Commonly known as: NAMENDA Take 1 tablet (10 mg total) by mouth 2 (two) times daily. What changed: how much to take   metoprolol tartrate 25 MG tablet Commonly known as: LOPRESSOR Take 25 mg by mouth 2 (two) times daily.   QUEtiapine 25 MG tablet Commonly known as: SEROQUEL Take 1 tablet (25 mg total) by mouth at bedtime as needed (agitation).   sertraline 100 MG tablet Commonly known as: ZOLOFT Take 1.5 tablets (150 mg total) by mouth daily.   traZODone 50 MG tablet Commonly known as: DESYREL Take 1 tablet (50 mg total) by mouth at bedtime.       Allergies  Allergen Reactions   Tessalon [Benzonatate] Shortness Of Breath   Atorvastatin       The results of significant diagnostics from this hospitalization (including imaging, microbiology, ancillary and laboratory) are listed below for reference.    Significant Diagnostic Studies: CT Chest Wo Contrast  Result Date: 10/29/2020 CLINICAL DATA:  Cough. Concern for aspiration pneumonia. Dementia. COPD. EXAM: CT CHEST WITHOUT CONTRAST TECHNIQUE: Multidetector CT imaging of the chest was performed following the standard protocol without IV contrast. COMPARISON:   06/27/2020 chest radiograph. FINDINGS: Cardiovascular: Mild motion degradation throughout. Aortic atherosclerosis. Pacer. Lad coronary artery calcification. Mediastinum/Nodes: Mildly prominent, macrolobulated right lobe of the thyroid suggests multinodular goiter. No mediastinal or definite hilar adenopathy, given limitations of unenhanced CT. Lungs/Pleura: No pleural fluid.  Mild centrilobular emphysema. Within both lower lobes, inferior right middle lobe, minimally within the lingula, and within the right apex are areas of ground-glass and peribronchovascular airspace. Upper Abdomen: Multiple hepatic cysts. Cholecystectomy. Artifact degradation from patient right greater than left hand position. Normal imaged portions of the spleen, stomach, pancreas, left kidney. 1.8 cm left adrenal low-density nodule including on 146/5. A 1.2 cm right adrenal nodule has indeterminate density measurements but is statistically most likely an adenoma. Abdominal aortic atherosclerosis. Musculoskeletal: No acute osseous abnormality. IMPRESSION: 1. Multifactorial degradation, including positioning and motion. 2. Multifocal ground-glass and peribronchovascular airspace opacities, consistent with (given clinical history) aspiration versus infection. 3. Aortic atherosclerosis (ICD10-I70.0), coronary artery atherosclerosis and emphysema (ICD10-J43.9). 4. Left adrenal adenoma. Right adrenal nodule is technically indeterminate but most likely also an adenoma. Electronically Signed   By: Abigail Miyamoto M.D.   On: 10/29/2020 13:26    Microbiology: Recent Results (from the past 240 hour(s))  Resp Panel by RT-PCR (Flu A&B, Covid) Nasopharyngeal Swab  Status: None   Collection Time: 10/29/20  6:02 PM   Specimen: Nasopharyngeal Swab; Nasopharyngeal(NP) swabs in vial transport medium  Result Value Ref Range Status   SARS Coronavirus 2 by RT PCR NEGATIVE NEGATIVE Final    Comment: (NOTE) SARS-CoV-2 target nucleic acids are NOT  DETECTED.  The SARS-CoV-2 RNA is generally detectable in upper respiratory specimens during the acute phase of infection. The lowest concentration of SARS-CoV-2 viral copies this assay can detect is 138 copies/mL. A negative result does not preclude SARS-Cov-2 infection and should not be used as the sole basis for treatment or other patient management decisions. A negative result may occur with  improper specimen collection/handling, submission of specimen other than nasopharyngeal swab, presence of viral mutation(s) within the areas targeted by this assay, and inadequate number of viral copies(<138 copies/mL). A negative result must be combined with clinical observations, patient history, and epidemiological information. The expected result is Negative.  Fact Sheet for Patients:  EntrepreneurPulse.com.au  Fact Sheet for Healthcare Providers:  IncredibleEmployment.be  This test is no t yet approved or cleared by the [place] FDA and  has been authorized for detection and/or diagnosis of SARS-CoV-2 by FDA under an Emergency Use Authorization (EUA). This EUA will remain  in effect (meaning this test can be used) for the duration of the COVID-19 declaration under Section 564(b)(1) of the Act, 21 U.S.C.section 360bbb-3(b)(1), unless the authorization is terminated  or revoked sooner.       Influenza A by PCR NEGATIVE NEGATIVE Final   Influenza B by PCR NEGATIVE NEGATIVE Final    Comment: (NOTE) The Xpert Xpress SARS-CoV-2/FLU/RSV plus assay is intended as an aid in the diagnosis of influenza from Nasopharyngeal swab specimens and should not be used as a sole basis for treatment. Nasal washings and aspirates are unacceptable for Xpert Xpress SARS-CoV-2/FLU/RSV testing.  Fact Sheet for Patients: EntrepreneurPulse.com.au  Fact Sheet for Healthcare Providers: IncredibleEmployment.be  This test is not yet  approved or cleared by the [place] FDA and has been authorized for detection and/or diagnosis of SARS-CoV-2 by FDA under an Emergency Use Authorization (EUA). This EUA will remain in effect (meaning this test can be used) for the duration of the COVID-19 declaration under Section 564(b)(1) of the Act, 21 U.S.C. section 360bbb-3(b)(1), unless the authorization is terminated or revoked.  Performed at [facility], [place] [address], [place], [place] [address]   Culture, blood (routine x 2)     Status: None (Preliminary result)   Collection Time: 10/29/20  9:13 PM   Specimen: BLOOD RIGHT ARM  Result Value Ref Range Status   Specimen Description BLOOD RIGHT ARM  Final   Special Requests   Final    BOTTLES DRAWN AEROBIC AND ANAEROBIC Blood Culture adequate volume   Culture   Final    NO GROWTH 4 DAYS Performed at [facility], [place] [address], [place], [place] [address]    Report Status PENDING  Incomplete  Culture, blood (routine x 2)     Status: Abnormal   Collection Time: 10/29/20  9:18 PM   Specimen: BLOOD LEFT ARM  Result Value Ref Range Status   Specimen Description BLOOD LEFT ARM  Final   Special Requests   Final    BOTTLES DRAWN AEROBIC AND ANAEROBIC Blood Culture adequate volume   Culture  Setup Time (A)  Final    GRAM VARIABLE ROD ANAEROBIC BOTTLE ONLY CRITICAL RESULT CALLED TO, READ BACK BY AND VERIFIED WITH: PHARMD J Iran  [id number] AT 1406 BY CM Performed at [facility], [place] [address], [place], [place] [address]    Culture KLEBSIELLA PNEUMONIAE (A)  Final   Report Status 11/01/2020 FINAL  Final   Organism ID, Bacteria KLEBSIELLA PNEUMONIAE  Final      Susceptibility   Klebsiella pneumoniae - MIC*    AMPICILLIN RESISTANT Resistant     CEFAZOLIN <=4 SENSITIVE Sensitive     CEFEPIME <=0.12 SENSITIVE Sensitive     CEFTAZIDIME <=1 SENSITIVE Sensitive     CEFTRIAXONE <=0.25 SENSITIVE Sensitive     CIPROFLOXACIN <=0.25 SENSITIVE Sensitive      GENTAMICIN <=1 SENSITIVE Sensitive     IMIPENEM <=0.25 SENSITIVE Sensitive     TRIMETH/SULFA <=20 SENSITIVE Sensitive     AMPICILLIN/SULBACTAM 4 SENSITIVE Sensitive     PIP/TAZO <=4 SENSITIVE Sensitive     * KLEBSIELLA PNEUMONIAE  Blood Culture ID Panel (Reflexed)     Status: Abnormal   Collection Time: 10/29/20  9:18 PM  Result Value Ref Range Status   Enterococcus faecalis NOT DETECTED NOT DETECTED Final   Enterococcus Faecium NOT DETECTED NOT DETECTED Final   Listeria monocytogenes NOT DETECTED NOT DETECTED Final   Staphylococcus species NOT DETECTED NOT DETECTED Final   Staphylococcus aureus (BCID) NOT DETECTED NOT DETECTED Final   Staphylococcus epidermidis NOT DETECTED NOT DETECTED Final   Staphylococcus lugdunensis NOT DETECTED NOT DETECTED Final   Streptococcus species NOT DETECTED NOT DETECTED Final   Streptococcus agalactiae NOT DETECTED NOT DETECTED Final   Streptococcus pneumoniae NOT DETECTED NOT DETECTED Final   Streptococcus pyogenes NOT DETECTED NOT DETECTED Final   A.calcoaceticus-baumannii NOT DETECTED NOT DETECTED Final   Bacteroides fragilis NOT DETECTED NOT DETECTED Final   Enterobacterales DETECTED (A) NOT DETECTED Final    Comment: Enterobacterales represent a large order of gram negative bacteria, not a single organism. CRITICAL RESULT CALLED TO, READ BACK BY AND VERIFIED WITH: PHARMD J FEANCE QA:[id number] AT 1406 BY CM    Enterobacter cloacae complex NOT DETECTED NOT DETECTED Final   Escherichia coli NOT DETECTED NOT DETECTED Final   Klebsiella aerogenes NOT DETECTED NOT DETECTED Final   Klebsiella oxytoca NOT DETECTED NOT DETECTED Final   Klebsiella pneumoniae DETECTED (A) NOT DETECTED Final    Comment: CRITICAL RESULT CALLED TO, READ BACK BY AND VERIFIED WITH: PHARMD J Iran QA:[id number] AT 1406 BY CM    Proteus species NOT DETECTED NOT DETECTED Final   Salmonella species NOT DETECTED NOT DETECTED Final   Serratia marcescens NOT DETECTED NOT DETECTED Final    Haemophilus influenzae NOT DETECTED NOT DETECTED Final   Neisseria meningitidis NOT DETECTED NOT DETECTED Final   Pseudomonas aeruginosa NOT DETECTED NOT DETECTED Final   Stenotrophomonas maltophilia NOT DETECTED NOT DETECTED Final   Candida albicans NOT DETECTED NOT DETECTED Final   Candida auris NOT DETECTED NOT DETECTED Final   Candida glabrata NOT DETECTED NOT DETECTED Final   Candida krusei NOT DETECTED NOT DETECTED Final   Candida parapsilosis NOT DETECTED NOT DETECTED Final   Candida tropicalis NOT DETECTED NOT DETECTED Final   Cryptococcus neoformans/gattii NOT DETECTED NOT DETECTED Final   CTX-M ESBL NOT DETECTED NOT DETECTED Final   Carbapenem resistance IMP NOT DETECTED NOT DETECTED Final   Carbapenem resistance KPC NOT DETECTED NOT DETECTED Final   Carbapenem resistance NDM NOT DETECTED NOT DETECTED Final   Carbapenem resist OXA 48 LIKE NOT DETECTED NOT DETECTED Final   Carbapenem resistance VIM NOT DETECTED NOT DETECTED Final    Comment: Performed at  [facility], [place] [address], [place], [place] [address]     Labs: Basic Metabolic Panel: Recent Labs  Lab 10/29/20 1641 11/01/20 0910 11/02/20 0025  NA 140 143 142  K 3.6 3.2* 2.8*  CL 105 106 106  CO2 '24 26 26  '$ GLUCOSE 144* 136* 126*  BUN '12 17 20  '$ CREATININE 1.03* 0.72 0.66  CALCIUM 9.2 9.0 8.8*  MG  --  2.0  --    Liver Function Tests: Recent Labs  Lab 11/02/20 0025  AST 31  ALT 19  ALKPHOS 55  BILITOT 0.5  PROT 5.5*  ALBUMIN 2.7*   No results for input(s): LIPASE, AMYLASE in the last 168 hours. No results for input(s): AMMONIA in the last 168 hours. CBC: Recent Labs  Lab 10/29/20 1641 10/30/20 0400 11/02/20 0025  WBC 16.9* 13.6* 9.8  NEUTROABS 15.6*  --  8.0*  HGB 12.8 12.7 12.1  HCT 40.1 40.8 37.3  MCV 83.5 83.6 81.6  PLT 246 221 98*   Cardiac Enzymes: No results for input(s): CKTOTAL, CKMB, CKMBINDEX, TROPONINI in the last 168 hours. BNP: BNP (last 3 results) No results for  input(s): BNP in the last 8760 hours.  ProBNP (last 3 results) No results for input(s): PROBNP in the last 8760 hours.  CBG: No results for input(s): GLUCAP in the last 168 hours.     Signed:  Nita Sells MD   [facility] 11/02/2020, 9:32 AM

## 2020-11-02 NOTE — Plan of Care (Signed)
  Problem: Education: Goal: Knowledge of General Education information will improve Description: Including pain rating scale, medication(s)/side effects and non-pharmacologic comfort measures 11/02/2020 1645 by Camillia Herter, RN Outcome: Adequate for Discharge 11/02/2020 1645 by Camillia Herter, RN Outcome: Progressing   Problem: Health Behavior/Discharge Planning: Goal: Ability to manage health-related needs will improve 11/02/2020 1645 by Camillia Herter, RN Outcome: Adequate for Discharge 11/02/2020 1645 by Camillia Herter, RN Outcome: Progressing   Problem: Clinical Measurements: Goal: Ability to maintain clinical measurements within normal limits will improve 11/02/2020 1645 by Camillia Herter, RN Outcome: Adequate for Discharge 11/02/2020 1645 by Camillia Herter, RN Outcome: Progressing Goal: Will remain free from infection 11/02/2020 1645 by Camillia Herter, RN Outcome: Adequate for Discharge 11/02/2020 1645 by Camillia Herter, RN Outcome: Progressing Goal: Diagnostic test results will improve 11/02/2020 1645 by Camillia Herter, RN Outcome: Adequate for Discharge 11/02/2020 1645 by Camillia Herter, RN Outcome: Progressing Goal: Respiratory complications will improve 11/02/2020 1645 by Camillia Herter, RN Outcome: Adequate for Discharge 11/02/2020 1645 by Julius Bowels A, RN Outcome: Progressing Goal: Cardiovascular complication will be avoided 11/02/2020 1645 by Camillia Herter, RN Outcome: Adequate for Discharge 11/02/2020 1645 by Camillia Herter, RN Outcome: Progressing   Problem: Activity: Goal: Risk for activity intolerance will decrease 11/02/2020 1645 by Camillia Herter, RN Outcome: Adequate for Discharge 11/02/2020 1645 by Camillia Herter, RN Outcome: Progressing   Problem: Nutrition: Goal: Adequate nutrition will be maintained 11/02/2020 1645 by Camillia Herter, RN Outcome: Adequate for Discharge 11/02/2020 1645 by Camillia Herter, RN Outcome: Progressing    Problem: Coping: Goal: Level of anxiety will decrease 11/02/2020 1645 by Camillia Herter, RN Outcome: Adequate for Discharge 11/02/2020 1645 by Camillia Herter, RN Outcome: Progressing   Problem: Elimination: Goal: Will not experience complications related to bowel motility 11/02/2020 1645 by Camillia Herter, RN Outcome: Adequate for Discharge 11/02/2020 1645 by Camillia Herter, RN Outcome: Progressing Goal: Will not experience complications related to urinary retention 11/02/2020 1645 by Camillia Herter, RN Outcome: Adequate for Discharge 11/02/2020 1645 by Camillia Herter, RN Outcome: Progressing   Problem: Pain Managment: Goal: General experience of comfort will improve 11/02/2020 1645 by Camillia Herter, RN Outcome: Adequate for Discharge 11/02/2020 1645 by Camillia Herter, RN Outcome: Progressing   Problem: Safety: Goal: Ability to remain free from injury will improve 11/02/2020 1645 by Camillia Herter, RN Outcome: Adequate for Discharge 11/02/2020 1645 by Camillia Herter, RN Outcome: Progressing   Problem: Skin Integrity: Goal: Risk for impaired skin integrity will decrease 11/02/2020 1645 by Camillia Herter, RN Outcome: Adequate for Discharge 11/02/2020 1645 by Camillia Herter, RN Outcome: Progressing

## 2020-11-02 NOTE — Care Management (Signed)
[facility] will come to see patient today around noon. They have spoken to [place]. [name] having covid symptoms and will not be coming to the hospital today.

## 2020-11-02 NOTE — Care Management (Addendum)
NCM spoke to [facility], patient does not meet criteria for their hospice home but NCM understood they could provide home hospice services.     NCM called Jodi Thompson and explained above. Jodi Thompson asked if patient could go to a nursing home for awhile. NCM explained for insurance to cover SNF she would need to be there for rehab. Care custodial care it would be private pay.   Jodi Thompson voiced understanding and aware patient has been discharged. Jodi Thompson put his daughter Jodi Thompson on speaker phone. Jodi Thompson and Jodi Thompson asking for ambulance transport home and cost. NCM explained [facility] would file with her insurance and they may get a bill, unable to determine prior to transport. Also aware since it is late in day [facility] may be late with transport. Jodi Thompson voiced understanding and asked if nurse could call them when [facility] arrives. NCM confirmed address with Jodi Thompson. Jodi Thompson states they do not need any DME   [facility] aware, she will submit information to her medical director to be sure patient will be accepted for home hospice services.   NCM will wait on determination before calling [facility].  Secure chatted MD for DNR form.   [facility] can accept for home hospice services.

## 2020-11-03 DIAGNOSIS — I495 Sick sinus syndrome: Secondary | ICD-10-CM | POA: Diagnosis not present

## 2020-11-03 DIAGNOSIS — G301 Alzheimer's disease with late onset: Secondary | ICD-10-CM | POA: Diagnosis not present

## 2020-11-03 DIAGNOSIS — F32A Depression, unspecified: Secondary | ICD-10-CM | POA: Diagnosis not present

## 2020-11-03 DIAGNOSIS — Z8701 Personal history of pneumonia (recurrent): Secondary | ICD-10-CM | POA: Diagnosis not present

## 2020-11-03 DIAGNOSIS — U071 COVID-19: Secondary | ICD-10-CM | POA: Diagnosis not present

## 2020-11-03 DIAGNOSIS — F419 Anxiety disorder, unspecified: Secondary | ICD-10-CM | POA: Diagnosis not present

## 2020-11-03 DIAGNOSIS — L89322 Pressure ulcer of left buttock, stage 2: Secondary | ICD-10-CM | POA: Diagnosis not present

## 2020-11-03 DIAGNOSIS — K219 Gastro-esophageal reflux disease without esophagitis: Secondary | ICD-10-CM | POA: Diagnosis not present

## 2020-11-03 DIAGNOSIS — I480 Paroxysmal atrial fibrillation: Secondary | ICD-10-CM | POA: Diagnosis not present

## 2020-11-03 DIAGNOSIS — E441 Mild protein-calorie malnutrition: Secondary | ICD-10-CM | POA: Diagnosis not present

## 2020-11-03 DIAGNOSIS — R1310 Dysphagia, unspecified: Secondary | ICD-10-CM | POA: Diagnosis not present

## 2020-11-03 DIAGNOSIS — J4530 Mild persistent asthma, uncomplicated: Secondary | ICD-10-CM | POA: Diagnosis not present

## 2020-11-03 DIAGNOSIS — F0280 Dementia in other diseases classified elsewhere without behavioral disturbance: Secondary | ICD-10-CM | POA: Diagnosis not present

## 2020-11-03 DIAGNOSIS — Z950 Presence of cardiac pacemaker: Secondary | ICD-10-CM | POA: Diagnosis not present

## 2020-11-03 DIAGNOSIS — I10 Essential (primary) hypertension: Secondary | ICD-10-CM | POA: Diagnosis not present

## 2020-11-05 DIAGNOSIS — I480 Paroxysmal atrial fibrillation: Secondary | ICD-10-CM | POA: Diagnosis not present

## 2020-11-05 DIAGNOSIS — G301 Alzheimer's disease with late onset: Secondary | ICD-10-CM | POA: Diagnosis not present

## 2020-11-05 DIAGNOSIS — Z950 Presence of cardiac pacemaker: Secondary | ICD-10-CM | POA: Diagnosis not present

## 2020-11-05 DIAGNOSIS — I495 Sick sinus syndrome: Secondary | ICD-10-CM | POA: Diagnosis not present

## 2020-11-05 DIAGNOSIS — R1310 Dysphagia, unspecified: Secondary | ICD-10-CM | POA: Diagnosis not present

## 2020-11-05 DIAGNOSIS — F0280 Dementia in other diseases classified elsewhere without behavioral disturbance: Secondary | ICD-10-CM | POA: Diagnosis not present

## 2020-11-06 DIAGNOSIS — F0280 Dementia in other diseases classified elsewhere without behavioral disturbance: Secondary | ICD-10-CM | POA: Diagnosis not present

## 2020-11-06 DIAGNOSIS — R1310 Dysphagia, unspecified: Secondary | ICD-10-CM | POA: Diagnosis not present

## 2020-11-06 DIAGNOSIS — I480 Paroxysmal atrial fibrillation: Secondary | ICD-10-CM | POA: Diagnosis not present

## 2020-11-06 DIAGNOSIS — Z950 Presence of cardiac pacemaker: Secondary | ICD-10-CM | POA: Diagnosis not present

## 2020-11-06 DIAGNOSIS — G301 Alzheimer's disease with late onset: Secondary | ICD-10-CM | POA: Diagnosis not present

## 2020-11-06 DIAGNOSIS — I495 Sick sinus syndrome: Secondary | ICD-10-CM | POA: Diagnosis not present

## 2020-11-06 LAB — CULTURE, BLOOD (ROUTINE X 2): Special Requests: ADEQUATE

## 2020-11-07 DIAGNOSIS — G301 Alzheimer's disease with late onset: Secondary | ICD-10-CM | POA: Diagnosis not present

## 2020-11-07 DIAGNOSIS — I495 Sick sinus syndrome: Secondary | ICD-10-CM | POA: Diagnosis not present

## 2020-11-07 DIAGNOSIS — Z950 Presence of cardiac pacemaker: Secondary | ICD-10-CM | POA: Diagnosis not present

## 2020-11-07 DIAGNOSIS — I480 Paroxysmal atrial fibrillation: Secondary | ICD-10-CM | POA: Diagnosis not present

## 2020-11-07 DIAGNOSIS — F0280 Dementia in other diseases classified elsewhere without behavioral disturbance: Secondary | ICD-10-CM | POA: Diagnosis not present

## 2020-11-07 DIAGNOSIS — R1310 Dysphagia, unspecified: Secondary | ICD-10-CM | POA: Diagnosis not present

## 2020-11-09 DIAGNOSIS — R1310 Dysphagia, unspecified: Secondary | ICD-10-CM | POA: Diagnosis not present

## 2020-11-09 DIAGNOSIS — G301 Alzheimer's disease with late onset: Secondary | ICD-10-CM | POA: Diagnosis not present

## 2020-11-09 DIAGNOSIS — F0280 Dementia in other diseases classified elsewhere without behavioral disturbance: Secondary | ICD-10-CM | POA: Diagnosis not present

## 2020-11-09 DIAGNOSIS — I480 Paroxysmal atrial fibrillation: Secondary | ICD-10-CM | POA: Diagnosis not present

## 2020-11-09 DIAGNOSIS — I495 Sick sinus syndrome: Secondary | ICD-10-CM | POA: Diagnosis not present

## 2020-11-09 DIAGNOSIS — Z950 Presence of cardiac pacemaker: Secondary | ICD-10-CM | POA: Diagnosis not present

## 2020-11-13 DIAGNOSIS — R1310 Dysphagia, unspecified: Secondary | ICD-10-CM | POA: Diagnosis not present

## 2020-11-13 DIAGNOSIS — I480 Paroxysmal atrial fibrillation: Secondary | ICD-10-CM | POA: Diagnosis not present

## 2020-11-13 DIAGNOSIS — I495 Sick sinus syndrome: Secondary | ICD-10-CM | POA: Diagnosis not present

## 2020-11-13 DIAGNOSIS — Z950 Presence of cardiac pacemaker: Secondary | ICD-10-CM | POA: Diagnosis not present

## 2020-11-13 DIAGNOSIS — F0280 Dementia in other diseases classified elsewhere without behavioral disturbance: Secondary | ICD-10-CM | POA: Diagnosis not present

## 2020-11-13 DIAGNOSIS — G301 Alzheimer's disease with late onset: Secondary | ICD-10-CM | POA: Diagnosis not present

## 2020-11-14 DIAGNOSIS — I480 Paroxysmal atrial fibrillation: Secondary | ICD-10-CM | POA: Diagnosis not present

## 2020-11-14 DIAGNOSIS — F0280 Dementia in other diseases classified elsewhere without behavioral disturbance: Secondary | ICD-10-CM | POA: Diagnosis not present

## 2020-11-14 DIAGNOSIS — R1310 Dysphagia, unspecified: Secondary | ICD-10-CM | POA: Diagnosis not present

## 2020-11-14 DIAGNOSIS — G301 Alzheimer's disease with late onset: Secondary | ICD-10-CM | POA: Diagnosis not present

## 2020-11-14 DIAGNOSIS — Z950 Presence of cardiac pacemaker: Secondary | ICD-10-CM | POA: Diagnosis not present

## 2020-11-14 DIAGNOSIS — I495 Sick sinus syndrome: Secondary | ICD-10-CM | POA: Diagnosis not present

## 2020-11-16 DIAGNOSIS — I495 Sick sinus syndrome: Secondary | ICD-10-CM | POA: Diagnosis not present

## 2020-11-16 DIAGNOSIS — Z950 Presence of cardiac pacemaker: Secondary | ICD-10-CM | POA: Diagnosis not present

## 2020-11-16 DIAGNOSIS — F0280 Dementia in other diseases classified elsewhere without behavioral disturbance: Secondary | ICD-10-CM | POA: Diagnosis not present

## 2020-11-16 DIAGNOSIS — G301 Alzheimer's disease with late onset: Secondary | ICD-10-CM | POA: Diagnosis not present

## 2020-11-16 DIAGNOSIS — I480 Paroxysmal atrial fibrillation: Secondary | ICD-10-CM | POA: Diagnosis not present

## 2020-11-16 DIAGNOSIS — R1310 Dysphagia, unspecified: Secondary | ICD-10-CM | POA: Diagnosis not present

## 2020-11-20 DIAGNOSIS — Z950 Presence of cardiac pacemaker: Secondary | ICD-10-CM | POA: Diagnosis not present

## 2020-11-20 DIAGNOSIS — I480 Paroxysmal atrial fibrillation: Secondary | ICD-10-CM | POA: Diagnosis not present

## 2020-11-20 DIAGNOSIS — R1310 Dysphagia, unspecified: Secondary | ICD-10-CM | POA: Diagnosis not present

## 2020-11-20 DIAGNOSIS — G301 Alzheimer's disease with late onset: Secondary | ICD-10-CM | POA: Diagnosis not present

## 2020-11-20 DIAGNOSIS — F0280 Dementia in other diseases classified elsewhere without behavioral disturbance: Secondary | ICD-10-CM | POA: Diagnosis not present

## 2020-11-20 DIAGNOSIS — I495 Sick sinus syndrome: Secondary | ICD-10-CM | POA: Diagnosis not present

## 2020-11-21 DIAGNOSIS — I480 Paroxysmal atrial fibrillation: Secondary | ICD-10-CM | POA: Diagnosis not present

## 2020-11-21 DIAGNOSIS — G301 Alzheimer's disease with late onset: Secondary | ICD-10-CM | POA: Diagnosis not present

## 2020-11-21 DIAGNOSIS — Z45018 Encounter for adjustment and management of other part of cardiac pacemaker: Secondary | ICD-10-CM | POA: Diagnosis not present

## 2020-11-21 DIAGNOSIS — Z950 Presence of cardiac pacemaker: Secondary | ICD-10-CM | POA: Diagnosis not present

## 2020-11-21 DIAGNOSIS — F0280 Dementia in other diseases classified elsewhere without behavioral disturbance: Secondary | ICD-10-CM | POA: Diagnosis not present

## 2020-11-21 DIAGNOSIS — R1310 Dysphagia, unspecified: Secondary | ICD-10-CM | POA: Diagnosis not present

## 2020-11-21 DIAGNOSIS — I495 Sick sinus syndrome: Secondary | ICD-10-CM | POA: Diagnosis not present

## 2020-11-22 DIAGNOSIS — I495 Sick sinus syndrome: Secondary | ICD-10-CM | POA: Diagnosis not present

## 2020-11-22 DIAGNOSIS — I480 Paroxysmal atrial fibrillation: Secondary | ICD-10-CM | POA: Diagnosis not present

## 2020-11-22 DIAGNOSIS — F0280 Dementia in other diseases classified elsewhere without behavioral disturbance: Secondary | ICD-10-CM | POA: Diagnosis not present

## 2020-11-22 DIAGNOSIS — G301 Alzheimer's disease with late onset: Secondary | ICD-10-CM | POA: Diagnosis not present

## 2020-11-22 DIAGNOSIS — R1310 Dysphagia, unspecified: Secondary | ICD-10-CM | POA: Diagnosis not present

## 2020-11-22 DIAGNOSIS — Z950 Presence of cardiac pacemaker: Secondary | ICD-10-CM | POA: Diagnosis not present

## 2020-11-24 DIAGNOSIS — R1310 Dysphagia, unspecified: Secondary | ICD-10-CM | POA: Diagnosis not present

## 2020-11-24 DIAGNOSIS — I495 Sick sinus syndrome: Secondary | ICD-10-CM | POA: Diagnosis not present

## 2020-11-24 DIAGNOSIS — F0280 Dementia in other diseases classified elsewhere without behavioral disturbance: Secondary | ICD-10-CM | POA: Diagnosis not present

## 2020-11-24 DIAGNOSIS — I480 Paroxysmal atrial fibrillation: Secondary | ICD-10-CM | POA: Diagnosis not present

## 2020-11-24 DIAGNOSIS — Z950 Presence of cardiac pacemaker: Secondary | ICD-10-CM | POA: Diagnosis not present

## 2020-11-24 DIAGNOSIS — G301 Alzheimer's disease with late onset: Secondary | ICD-10-CM | POA: Diagnosis not present

## 2020-11-27 DIAGNOSIS — Z950 Presence of cardiac pacemaker: Secondary | ICD-10-CM | POA: Diagnosis not present

## 2020-11-27 DIAGNOSIS — F0280 Dementia in other diseases classified elsewhere without behavioral disturbance: Secondary | ICD-10-CM | POA: Diagnosis not present

## 2020-11-27 DIAGNOSIS — R1310 Dysphagia, unspecified: Secondary | ICD-10-CM | POA: Diagnosis not present

## 2020-11-27 DIAGNOSIS — G301 Alzheimer's disease with late onset: Secondary | ICD-10-CM | POA: Diagnosis not present

## 2020-11-27 DIAGNOSIS — I495 Sick sinus syndrome: Secondary | ICD-10-CM | POA: Diagnosis not present

## 2020-11-27 DIAGNOSIS — I480 Paroxysmal atrial fibrillation: Secondary | ICD-10-CM | POA: Diagnosis not present

## 2020-11-28 DIAGNOSIS — Z950 Presence of cardiac pacemaker: Secondary | ICD-10-CM | POA: Diagnosis not present

## 2020-11-28 DIAGNOSIS — I480 Paroxysmal atrial fibrillation: Secondary | ICD-10-CM | POA: Diagnosis not present

## 2020-11-28 DIAGNOSIS — I495 Sick sinus syndrome: Secondary | ICD-10-CM | POA: Diagnosis not present

## 2020-11-28 DIAGNOSIS — R1310 Dysphagia, unspecified: Secondary | ICD-10-CM | POA: Diagnosis not present

## 2020-11-28 DIAGNOSIS — G301 Alzheimer's disease with late onset: Secondary | ICD-10-CM | POA: Diagnosis not present

## 2020-11-28 DIAGNOSIS — F0280 Dementia in other diseases classified elsewhere without behavioral disturbance: Secondary | ICD-10-CM | POA: Diagnosis not present

## 2020-11-29 DIAGNOSIS — I480 Paroxysmal atrial fibrillation: Secondary | ICD-10-CM | POA: Diagnosis not present

## 2020-11-29 DIAGNOSIS — Z950 Presence of cardiac pacemaker: Secondary | ICD-10-CM | POA: Diagnosis not present

## 2020-11-29 DIAGNOSIS — F0280 Dementia in other diseases classified elsewhere without behavioral disturbance: Secondary | ICD-10-CM | POA: Diagnosis not present

## 2020-11-29 DIAGNOSIS — I495 Sick sinus syndrome: Secondary | ICD-10-CM | POA: Diagnosis not present

## 2020-11-29 DIAGNOSIS — R1310 Dysphagia, unspecified: Secondary | ICD-10-CM | POA: Diagnosis not present

## 2020-11-29 DIAGNOSIS — G301 Alzheimer's disease with late onset: Secondary | ICD-10-CM | POA: Diagnosis not present

## 2020-11-30 DIAGNOSIS — G301 Alzheimer's disease with late onset: Secondary | ICD-10-CM | POA: Diagnosis not present

## 2020-11-30 DIAGNOSIS — I480 Paroxysmal atrial fibrillation: Secondary | ICD-10-CM | POA: Diagnosis not present

## 2020-11-30 DIAGNOSIS — U071 COVID-19: Secondary | ICD-10-CM | POA: Diagnosis not present

## 2020-11-30 DIAGNOSIS — K219 Gastro-esophageal reflux disease without esophagitis: Secondary | ICD-10-CM | POA: Diagnosis not present

## 2020-11-30 DIAGNOSIS — R1310 Dysphagia, unspecified: Secondary | ICD-10-CM | POA: Diagnosis not present

## 2020-11-30 DIAGNOSIS — I495 Sick sinus syndrome: Secondary | ICD-10-CM | POA: Diagnosis not present

## 2020-11-30 DIAGNOSIS — Z8701 Personal history of pneumonia (recurrent): Secondary | ICD-10-CM | POA: Diagnosis not present

## 2020-11-30 DIAGNOSIS — I10 Essential (primary) hypertension: Secondary | ICD-10-CM | POA: Diagnosis not present

## 2020-11-30 DIAGNOSIS — F0280 Dementia in other diseases classified elsewhere without behavioral disturbance: Secondary | ICD-10-CM | POA: Diagnosis not present

## 2020-11-30 DIAGNOSIS — F419 Anxiety disorder, unspecified: Secondary | ICD-10-CM | POA: Diagnosis not present

## 2020-11-30 DIAGNOSIS — L89322 Pressure ulcer of left buttock, stage 2: Secondary | ICD-10-CM | POA: Diagnosis not present

## 2020-11-30 DIAGNOSIS — J4530 Mild persistent asthma, uncomplicated: Secondary | ICD-10-CM | POA: Diagnosis not present

## 2020-11-30 DIAGNOSIS — F32A Depression, unspecified: Secondary | ICD-10-CM | POA: Diagnosis not present

## 2020-11-30 DIAGNOSIS — E441 Mild protein-calorie malnutrition: Secondary | ICD-10-CM | POA: Diagnosis not present

## 2020-11-30 DIAGNOSIS — Z950 Presence of cardiac pacemaker: Secondary | ICD-10-CM | POA: Diagnosis not present

## 2020-12-01 DIAGNOSIS — F0280 Dementia in other diseases classified elsewhere without behavioral disturbance: Secondary | ICD-10-CM | POA: Diagnosis not present

## 2020-12-01 DIAGNOSIS — R1310 Dysphagia, unspecified: Secondary | ICD-10-CM | POA: Diagnosis not present

## 2020-12-01 DIAGNOSIS — G301 Alzheimer's disease with late onset: Secondary | ICD-10-CM | POA: Diagnosis not present

## 2020-12-01 DIAGNOSIS — I495 Sick sinus syndrome: Secondary | ICD-10-CM | POA: Diagnosis not present

## 2020-12-01 DIAGNOSIS — I480 Paroxysmal atrial fibrillation: Secondary | ICD-10-CM | POA: Diagnosis not present

## 2020-12-01 DIAGNOSIS — Z950 Presence of cardiac pacemaker: Secondary | ICD-10-CM | POA: Diagnosis not present

## 2020-12-06 DIAGNOSIS — I495 Sick sinus syndrome: Secondary | ICD-10-CM | POA: Diagnosis not present

## 2020-12-06 DIAGNOSIS — G301 Alzheimer's disease with late onset: Secondary | ICD-10-CM | POA: Diagnosis not present

## 2020-12-06 DIAGNOSIS — R1310 Dysphagia, unspecified: Secondary | ICD-10-CM | POA: Diagnosis not present

## 2020-12-06 DIAGNOSIS — F0280 Dementia in other diseases classified elsewhere without behavioral disturbance: Secondary | ICD-10-CM | POA: Diagnosis not present

## 2020-12-06 DIAGNOSIS — Z950 Presence of cardiac pacemaker: Secondary | ICD-10-CM | POA: Diagnosis not present

## 2020-12-06 DIAGNOSIS — I480 Paroxysmal atrial fibrillation: Secondary | ICD-10-CM | POA: Diagnosis not present

## 2020-12-08 DIAGNOSIS — R1310 Dysphagia, unspecified: Secondary | ICD-10-CM | POA: Diagnosis not present

## 2020-12-08 DIAGNOSIS — I480 Paroxysmal atrial fibrillation: Secondary | ICD-10-CM | POA: Diagnosis not present

## 2020-12-08 DIAGNOSIS — Z950 Presence of cardiac pacemaker: Secondary | ICD-10-CM | POA: Diagnosis not present

## 2020-12-08 DIAGNOSIS — F0280 Dementia in other diseases classified elsewhere without behavioral disturbance: Secondary | ICD-10-CM | POA: Diagnosis not present

## 2020-12-08 DIAGNOSIS — G301 Alzheimer's disease with late onset: Secondary | ICD-10-CM | POA: Diagnosis not present

## 2020-12-08 DIAGNOSIS — I495 Sick sinus syndrome: Secondary | ICD-10-CM | POA: Diagnosis not present

## 2020-12-11 DIAGNOSIS — I480 Paroxysmal atrial fibrillation: Secondary | ICD-10-CM | POA: Diagnosis not present

## 2020-12-11 DIAGNOSIS — R1310 Dysphagia, unspecified: Secondary | ICD-10-CM | POA: Diagnosis not present

## 2020-12-11 DIAGNOSIS — I495 Sick sinus syndrome: Secondary | ICD-10-CM | POA: Diagnosis not present

## 2020-12-11 DIAGNOSIS — Z950 Presence of cardiac pacemaker: Secondary | ICD-10-CM | POA: Diagnosis not present

## 2020-12-11 DIAGNOSIS — F0280 Dementia in other diseases classified elsewhere without behavioral disturbance: Secondary | ICD-10-CM | POA: Diagnosis not present

## 2020-12-11 DIAGNOSIS — G301 Alzheimer's disease with late onset: Secondary | ICD-10-CM | POA: Diagnosis not present

## 2020-12-12 DIAGNOSIS — I480 Paroxysmal atrial fibrillation: Secondary | ICD-10-CM | POA: Diagnosis not present

## 2020-12-12 DIAGNOSIS — F0280 Dementia in other diseases classified elsewhere without behavioral disturbance: Secondary | ICD-10-CM | POA: Diagnosis not present

## 2020-12-12 DIAGNOSIS — I495 Sick sinus syndrome: Secondary | ICD-10-CM | POA: Diagnosis not present

## 2020-12-12 DIAGNOSIS — R1310 Dysphagia, unspecified: Secondary | ICD-10-CM | POA: Diagnosis not present

## 2020-12-12 DIAGNOSIS — Z950 Presence of cardiac pacemaker: Secondary | ICD-10-CM | POA: Diagnosis not present

## 2020-12-12 DIAGNOSIS — G301 Alzheimer's disease with late onset: Secondary | ICD-10-CM | POA: Diagnosis not present

## 2020-12-13 DIAGNOSIS — G301 Alzheimer's disease with late onset: Secondary | ICD-10-CM | POA: Diagnosis not present

## 2020-12-13 DIAGNOSIS — I480 Paroxysmal atrial fibrillation: Secondary | ICD-10-CM | POA: Diagnosis not present

## 2020-12-13 DIAGNOSIS — Z950 Presence of cardiac pacemaker: Secondary | ICD-10-CM | POA: Diagnosis not present

## 2020-12-13 DIAGNOSIS — F0280 Dementia in other diseases classified elsewhere without behavioral disturbance: Secondary | ICD-10-CM | POA: Diagnosis not present

## 2020-12-13 DIAGNOSIS — I495 Sick sinus syndrome: Secondary | ICD-10-CM | POA: Diagnosis not present

## 2020-12-13 DIAGNOSIS — R1310 Dysphagia, unspecified: Secondary | ICD-10-CM | POA: Diagnosis not present

## 2020-12-15 DIAGNOSIS — I480 Paroxysmal atrial fibrillation: Secondary | ICD-10-CM | POA: Diagnosis not present

## 2020-12-15 DIAGNOSIS — R1310 Dysphagia, unspecified: Secondary | ICD-10-CM | POA: Diagnosis not present

## 2020-12-15 DIAGNOSIS — Z950 Presence of cardiac pacemaker: Secondary | ICD-10-CM | POA: Diagnosis not present

## 2020-12-15 DIAGNOSIS — G301 Alzheimer's disease with late onset: Secondary | ICD-10-CM | POA: Diagnosis not present

## 2020-12-15 DIAGNOSIS — F0280 Dementia in other diseases classified elsewhere without behavioral disturbance: Secondary | ICD-10-CM | POA: Diagnosis not present

## 2020-12-15 DIAGNOSIS — I495 Sick sinus syndrome: Secondary | ICD-10-CM | POA: Diagnosis not present

## 2020-12-18 DIAGNOSIS — I495 Sick sinus syndrome: Secondary | ICD-10-CM | POA: Diagnosis not present

## 2020-12-18 DIAGNOSIS — Z950 Presence of cardiac pacemaker: Secondary | ICD-10-CM | POA: Diagnosis not present

## 2020-12-18 DIAGNOSIS — I480 Paroxysmal atrial fibrillation: Secondary | ICD-10-CM | POA: Diagnosis not present

## 2020-12-18 DIAGNOSIS — F0280 Dementia in other diseases classified elsewhere without behavioral disturbance: Secondary | ICD-10-CM | POA: Diagnosis not present

## 2020-12-18 DIAGNOSIS — R1310 Dysphagia, unspecified: Secondary | ICD-10-CM | POA: Diagnosis not present

## 2020-12-18 DIAGNOSIS — G301 Alzheimer's disease with late onset: Secondary | ICD-10-CM | POA: Diagnosis not present

## 2020-12-19 DIAGNOSIS — G301 Alzheimer's disease with late onset: Secondary | ICD-10-CM | POA: Diagnosis not present

## 2020-12-19 DIAGNOSIS — R1310 Dysphagia, unspecified: Secondary | ICD-10-CM | POA: Diagnosis not present

## 2020-12-19 DIAGNOSIS — I480 Paroxysmal atrial fibrillation: Secondary | ICD-10-CM | POA: Diagnosis not present

## 2020-12-19 DIAGNOSIS — I495 Sick sinus syndrome: Secondary | ICD-10-CM | POA: Diagnosis not present

## 2020-12-19 DIAGNOSIS — F0280 Dementia in other diseases classified elsewhere without behavioral disturbance: Secondary | ICD-10-CM | POA: Diagnosis not present

## 2020-12-19 DIAGNOSIS — Z950 Presence of cardiac pacemaker: Secondary | ICD-10-CM | POA: Diagnosis not present

## 2020-12-20 DIAGNOSIS — I480 Paroxysmal atrial fibrillation: Secondary | ICD-10-CM | POA: Diagnosis not present

## 2020-12-20 DIAGNOSIS — Z950 Presence of cardiac pacemaker: Secondary | ICD-10-CM | POA: Diagnosis not present

## 2020-12-20 DIAGNOSIS — I495 Sick sinus syndrome: Secondary | ICD-10-CM | POA: Diagnosis not present

## 2020-12-20 DIAGNOSIS — F0280 Dementia in other diseases classified elsewhere without behavioral disturbance: Secondary | ICD-10-CM | POA: Diagnosis not present

## 2020-12-20 DIAGNOSIS — G301 Alzheimer's disease with late onset: Secondary | ICD-10-CM | POA: Diagnosis not present

## 2020-12-20 DIAGNOSIS — R1310 Dysphagia, unspecified: Secondary | ICD-10-CM | POA: Diagnosis not present

## 2020-12-21 DIAGNOSIS — R1310 Dysphagia, unspecified: Secondary | ICD-10-CM | POA: Diagnosis not present

## 2020-12-21 DIAGNOSIS — Z950 Presence of cardiac pacemaker: Secondary | ICD-10-CM | POA: Diagnosis not present

## 2020-12-21 DIAGNOSIS — I480 Paroxysmal atrial fibrillation: Secondary | ICD-10-CM | POA: Diagnosis not present

## 2020-12-21 DIAGNOSIS — G301 Alzheimer's disease with late onset: Secondary | ICD-10-CM | POA: Diagnosis not present

## 2020-12-21 DIAGNOSIS — I495 Sick sinus syndrome: Secondary | ICD-10-CM | POA: Diagnosis not present

## 2020-12-21 DIAGNOSIS — F0280 Dementia in other diseases classified elsewhere without behavioral disturbance: Secondary | ICD-10-CM | POA: Diagnosis not present

## 2020-12-22 DIAGNOSIS — I480 Paroxysmal atrial fibrillation: Secondary | ICD-10-CM | POA: Diagnosis not present

## 2020-12-22 DIAGNOSIS — R1310 Dysphagia, unspecified: Secondary | ICD-10-CM | POA: Diagnosis not present

## 2020-12-22 DIAGNOSIS — Z950 Presence of cardiac pacemaker: Secondary | ICD-10-CM | POA: Diagnosis not present

## 2020-12-22 DIAGNOSIS — I495 Sick sinus syndrome: Secondary | ICD-10-CM | POA: Diagnosis not present

## 2020-12-22 DIAGNOSIS — G301 Alzheimer's disease with late onset: Secondary | ICD-10-CM | POA: Diagnosis not present

## 2020-12-22 DIAGNOSIS — F0280 Dementia in other diseases classified elsewhere without behavioral disturbance: Secondary | ICD-10-CM | POA: Diagnosis not present

## 2020-12-25 DIAGNOSIS — I495 Sick sinus syndrome: Secondary | ICD-10-CM | POA: Diagnosis not present

## 2020-12-25 DIAGNOSIS — R1310 Dysphagia, unspecified: Secondary | ICD-10-CM | POA: Diagnosis not present

## 2020-12-25 DIAGNOSIS — G301 Alzheimer's disease with late onset: Secondary | ICD-10-CM | POA: Diagnosis not present

## 2020-12-25 DIAGNOSIS — I480 Paroxysmal atrial fibrillation: Secondary | ICD-10-CM | POA: Diagnosis not present

## 2020-12-25 DIAGNOSIS — F0280 Dementia in other diseases classified elsewhere without behavioral disturbance: Secondary | ICD-10-CM | POA: Diagnosis not present

## 2020-12-25 DIAGNOSIS — Z950 Presence of cardiac pacemaker: Secondary | ICD-10-CM | POA: Diagnosis not present

## 2020-12-26 DIAGNOSIS — I480 Paroxysmal atrial fibrillation: Secondary | ICD-10-CM | POA: Diagnosis not present

## 2020-12-26 DIAGNOSIS — I495 Sick sinus syndrome: Secondary | ICD-10-CM | POA: Diagnosis not present

## 2020-12-26 DIAGNOSIS — F0280 Dementia in other diseases classified elsewhere without behavioral disturbance: Secondary | ICD-10-CM | POA: Diagnosis not present

## 2020-12-26 DIAGNOSIS — Z950 Presence of cardiac pacemaker: Secondary | ICD-10-CM | POA: Diagnosis not present

## 2020-12-26 DIAGNOSIS — R1310 Dysphagia, unspecified: Secondary | ICD-10-CM | POA: Diagnosis not present

## 2020-12-26 DIAGNOSIS — G301 Alzheimer's disease with late onset: Secondary | ICD-10-CM | POA: Diagnosis not present

## 2020-12-27 DIAGNOSIS — I495 Sick sinus syndrome: Secondary | ICD-10-CM | POA: Diagnosis not present

## 2020-12-27 DIAGNOSIS — G301 Alzheimer's disease with late onset: Secondary | ICD-10-CM | POA: Diagnosis not present

## 2020-12-27 DIAGNOSIS — I480 Paroxysmal atrial fibrillation: Secondary | ICD-10-CM | POA: Diagnosis not present

## 2020-12-27 DIAGNOSIS — Z950 Presence of cardiac pacemaker: Secondary | ICD-10-CM | POA: Diagnosis not present

## 2020-12-27 DIAGNOSIS — F0280 Dementia in other diseases classified elsewhere without behavioral disturbance: Secondary | ICD-10-CM | POA: Diagnosis not present

## 2020-12-27 DIAGNOSIS — R1310 Dysphagia, unspecified: Secondary | ICD-10-CM | POA: Diagnosis not present

## 2020-12-29 DIAGNOSIS — R1310 Dysphagia, unspecified: Secondary | ICD-10-CM | POA: Diagnosis not present

## 2020-12-29 DIAGNOSIS — F0280 Dementia in other diseases classified elsewhere without behavioral disturbance: Secondary | ICD-10-CM | POA: Diagnosis not present

## 2020-12-29 DIAGNOSIS — G301 Alzheimer's disease with late onset: Secondary | ICD-10-CM | POA: Diagnosis not present

## 2020-12-29 DIAGNOSIS — I480 Paroxysmal atrial fibrillation: Secondary | ICD-10-CM | POA: Diagnosis not present

## 2020-12-29 DIAGNOSIS — I495 Sick sinus syndrome: Secondary | ICD-10-CM | POA: Diagnosis not present

## 2020-12-29 DIAGNOSIS — Z950 Presence of cardiac pacemaker: Secondary | ICD-10-CM | POA: Diagnosis not present

## 2020-12-30 DIAGNOSIS — F419 Anxiety disorder, unspecified: Secondary | ICD-10-CM | POA: Diagnosis not present

## 2020-12-30 DIAGNOSIS — Z8701 Personal history of pneumonia (recurrent): Secondary | ICD-10-CM | POA: Diagnosis not present

## 2020-12-30 DIAGNOSIS — F32A Depression, unspecified: Secondary | ICD-10-CM | POA: Diagnosis not present

## 2020-12-30 DIAGNOSIS — I495 Sick sinus syndrome: Secondary | ICD-10-CM | POA: Diagnosis not present

## 2020-12-30 DIAGNOSIS — R1310 Dysphagia, unspecified: Secondary | ICD-10-CM | POA: Diagnosis not present

## 2020-12-30 DIAGNOSIS — L89322 Pressure ulcer of left buttock, stage 2: Secondary | ICD-10-CM | POA: Diagnosis not present

## 2020-12-30 DIAGNOSIS — Z8616 Personal history of COVID-19: Secondary | ICD-10-CM | POA: Diagnosis not present

## 2020-12-30 DIAGNOSIS — G301 Alzheimer's disease with late onset: Secondary | ICD-10-CM | POA: Diagnosis not present

## 2020-12-30 DIAGNOSIS — I10 Essential (primary) hypertension: Secondary | ICD-10-CM | POA: Diagnosis not present

## 2020-12-30 DIAGNOSIS — J4530 Mild persistent asthma, uncomplicated: Secondary | ICD-10-CM | POA: Diagnosis not present

## 2020-12-30 DIAGNOSIS — F0280 Dementia in other diseases classified elsewhere without behavioral disturbance: Secondary | ICD-10-CM | POA: Diagnosis not present

## 2020-12-30 DIAGNOSIS — Z950 Presence of cardiac pacemaker: Secondary | ICD-10-CM | POA: Diagnosis not present

## 2020-12-30 DIAGNOSIS — K219 Gastro-esophageal reflux disease without esophagitis: Secondary | ICD-10-CM | POA: Diagnosis not present

## 2020-12-30 DIAGNOSIS — E441 Mild protein-calorie malnutrition: Secondary | ICD-10-CM | POA: Diagnosis not present

## 2020-12-30 DIAGNOSIS — I480 Paroxysmal atrial fibrillation: Secondary | ICD-10-CM | POA: Diagnosis not present

## 2021-01-01 DIAGNOSIS — G301 Alzheimer's disease with late onset: Secondary | ICD-10-CM | POA: Diagnosis not present

## 2021-01-01 DIAGNOSIS — I495 Sick sinus syndrome: Secondary | ICD-10-CM | POA: Diagnosis not present

## 2021-01-01 DIAGNOSIS — I480 Paroxysmal atrial fibrillation: Secondary | ICD-10-CM | POA: Diagnosis not present

## 2021-01-01 DIAGNOSIS — Z950 Presence of cardiac pacemaker: Secondary | ICD-10-CM | POA: Diagnosis not present

## 2021-01-01 DIAGNOSIS — F0280 Dementia in other diseases classified elsewhere without behavioral disturbance: Secondary | ICD-10-CM | POA: Diagnosis not present

## 2021-01-01 DIAGNOSIS — R1310 Dysphagia, unspecified: Secondary | ICD-10-CM | POA: Diagnosis not present

## 2021-01-02 DIAGNOSIS — R1310 Dysphagia, unspecified: Secondary | ICD-10-CM | POA: Diagnosis not present

## 2021-01-02 DIAGNOSIS — I480 Paroxysmal atrial fibrillation: Secondary | ICD-10-CM | POA: Diagnosis not present

## 2021-01-02 DIAGNOSIS — F0280 Dementia in other diseases classified elsewhere without behavioral disturbance: Secondary | ICD-10-CM | POA: Diagnosis not present

## 2021-01-02 DIAGNOSIS — G301 Alzheimer's disease with late onset: Secondary | ICD-10-CM | POA: Diagnosis not present

## 2021-01-02 DIAGNOSIS — Z950 Presence of cardiac pacemaker: Secondary | ICD-10-CM | POA: Diagnosis not present

## 2021-01-02 DIAGNOSIS — I495 Sick sinus syndrome: Secondary | ICD-10-CM | POA: Diagnosis not present

## 2021-01-03 DIAGNOSIS — I495 Sick sinus syndrome: Secondary | ICD-10-CM | POA: Diagnosis not present

## 2021-01-03 DIAGNOSIS — I480 Paroxysmal atrial fibrillation: Secondary | ICD-10-CM | POA: Diagnosis not present

## 2021-01-03 DIAGNOSIS — G301 Alzheimer's disease with late onset: Secondary | ICD-10-CM | POA: Diagnosis not present

## 2021-01-03 DIAGNOSIS — F0280 Dementia in other diseases classified elsewhere without behavioral disturbance: Secondary | ICD-10-CM | POA: Diagnosis not present

## 2021-01-03 DIAGNOSIS — R1310 Dysphagia, unspecified: Secondary | ICD-10-CM | POA: Diagnosis not present

## 2021-01-03 DIAGNOSIS — Z950 Presence of cardiac pacemaker: Secondary | ICD-10-CM | POA: Diagnosis not present

## 2021-01-04 DIAGNOSIS — F0280 Dementia in other diseases classified elsewhere without behavioral disturbance: Secondary | ICD-10-CM | POA: Diagnosis not present

## 2021-01-04 DIAGNOSIS — I480 Paroxysmal atrial fibrillation: Secondary | ICD-10-CM | POA: Diagnosis not present

## 2021-01-04 DIAGNOSIS — I495 Sick sinus syndrome: Secondary | ICD-10-CM | POA: Diagnosis not present

## 2021-01-04 DIAGNOSIS — R1310 Dysphagia, unspecified: Secondary | ICD-10-CM | POA: Diagnosis not present

## 2021-01-04 DIAGNOSIS — G301 Alzheimer's disease with late onset: Secondary | ICD-10-CM | POA: Diagnosis not present

## 2021-01-04 DIAGNOSIS — Z950 Presence of cardiac pacemaker: Secondary | ICD-10-CM | POA: Diagnosis not present

## 2021-01-05 DIAGNOSIS — R1310 Dysphagia, unspecified: Secondary | ICD-10-CM | POA: Diagnosis not present

## 2021-01-05 DIAGNOSIS — F0280 Dementia in other diseases classified elsewhere without behavioral disturbance: Secondary | ICD-10-CM | POA: Diagnosis not present

## 2021-01-05 DIAGNOSIS — G301 Alzheimer's disease with late onset: Secondary | ICD-10-CM | POA: Diagnosis not present

## 2021-01-05 DIAGNOSIS — I480 Paroxysmal atrial fibrillation: Secondary | ICD-10-CM | POA: Diagnosis not present

## 2021-01-05 DIAGNOSIS — I495 Sick sinus syndrome: Secondary | ICD-10-CM | POA: Diagnosis not present

## 2021-01-05 DIAGNOSIS — Z950 Presence of cardiac pacemaker: Secondary | ICD-10-CM | POA: Diagnosis not present

## 2021-01-06 DIAGNOSIS — Z950 Presence of cardiac pacemaker: Secondary | ICD-10-CM | POA: Diagnosis not present

## 2021-01-06 DIAGNOSIS — R1310 Dysphagia, unspecified: Secondary | ICD-10-CM | POA: Diagnosis not present

## 2021-01-06 DIAGNOSIS — F0280 Dementia in other diseases classified elsewhere without behavioral disturbance: Secondary | ICD-10-CM | POA: Diagnosis not present

## 2021-01-06 DIAGNOSIS — G301 Alzheimer's disease with late onset: Secondary | ICD-10-CM | POA: Diagnosis not present

## 2021-01-06 DIAGNOSIS — I480 Paroxysmal atrial fibrillation: Secondary | ICD-10-CM | POA: Diagnosis not present

## 2021-01-06 DIAGNOSIS — I495 Sick sinus syndrome: Secondary | ICD-10-CM | POA: Diagnosis not present

## 2021-01-08 DIAGNOSIS — R1310 Dysphagia, unspecified: Secondary | ICD-10-CM | POA: Diagnosis not present

## 2021-01-08 DIAGNOSIS — Z950 Presence of cardiac pacemaker: Secondary | ICD-10-CM | POA: Diagnosis not present

## 2021-01-08 DIAGNOSIS — G301 Alzheimer's disease with late onset: Secondary | ICD-10-CM | POA: Diagnosis not present

## 2021-01-08 DIAGNOSIS — F0280 Dementia in other diseases classified elsewhere without behavioral disturbance: Secondary | ICD-10-CM | POA: Diagnosis not present

## 2021-01-08 DIAGNOSIS — I480 Paroxysmal atrial fibrillation: Secondary | ICD-10-CM | POA: Diagnosis not present

## 2021-01-08 DIAGNOSIS — I495 Sick sinus syndrome: Secondary | ICD-10-CM | POA: Diagnosis not present

## 2021-01-10 DIAGNOSIS — I480 Paroxysmal atrial fibrillation: Secondary | ICD-10-CM | POA: Diagnosis not present

## 2021-01-10 DIAGNOSIS — F0280 Dementia in other diseases classified elsewhere without behavioral disturbance: Secondary | ICD-10-CM | POA: Diagnosis not present

## 2021-01-10 DIAGNOSIS — I495 Sick sinus syndrome: Secondary | ICD-10-CM | POA: Diagnosis not present

## 2021-01-10 DIAGNOSIS — Z950 Presence of cardiac pacemaker: Secondary | ICD-10-CM | POA: Diagnosis not present

## 2021-01-10 DIAGNOSIS — G301 Alzheimer's disease with late onset: Secondary | ICD-10-CM | POA: Diagnosis not present

## 2021-01-10 DIAGNOSIS — R1310 Dysphagia, unspecified: Secondary | ICD-10-CM | POA: Diagnosis not present

## 2021-01-12 DIAGNOSIS — Z950 Presence of cardiac pacemaker: Secondary | ICD-10-CM | POA: Diagnosis not present

## 2021-01-12 DIAGNOSIS — G301 Alzheimer's disease with late onset: Secondary | ICD-10-CM | POA: Diagnosis not present

## 2021-01-12 DIAGNOSIS — R1310 Dysphagia, unspecified: Secondary | ICD-10-CM | POA: Diagnosis not present

## 2021-01-12 DIAGNOSIS — I480 Paroxysmal atrial fibrillation: Secondary | ICD-10-CM | POA: Diagnosis not present

## 2021-01-12 DIAGNOSIS — F0280 Dementia in other diseases classified elsewhere without behavioral disturbance: Secondary | ICD-10-CM | POA: Diagnosis not present

## 2021-01-12 DIAGNOSIS — I495 Sick sinus syndrome: Secondary | ICD-10-CM | POA: Diagnosis not present

## 2021-01-15 DIAGNOSIS — Z950 Presence of cardiac pacemaker: Secondary | ICD-10-CM | POA: Diagnosis not present

## 2021-01-15 DIAGNOSIS — G301 Alzheimer's disease with late onset: Secondary | ICD-10-CM | POA: Diagnosis not present

## 2021-01-15 DIAGNOSIS — F0280 Dementia in other diseases classified elsewhere without behavioral disturbance: Secondary | ICD-10-CM | POA: Diagnosis not present

## 2021-01-15 DIAGNOSIS — I480 Paroxysmal atrial fibrillation: Secondary | ICD-10-CM | POA: Diagnosis not present

## 2021-01-15 DIAGNOSIS — R1310 Dysphagia, unspecified: Secondary | ICD-10-CM | POA: Diagnosis not present

## 2021-01-15 DIAGNOSIS — I495 Sick sinus syndrome: Secondary | ICD-10-CM | POA: Diagnosis not present

## 2021-01-17 DIAGNOSIS — F0280 Dementia in other diseases classified elsewhere without behavioral disturbance: Secondary | ICD-10-CM | POA: Diagnosis not present

## 2021-01-17 DIAGNOSIS — I495 Sick sinus syndrome: Secondary | ICD-10-CM | POA: Diagnosis not present

## 2021-01-17 DIAGNOSIS — G301 Alzheimer's disease with late onset: Secondary | ICD-10-CM | POA: Diagnosis not present

## 2021-01-17 DIAGNOSIS — Z950 Presence of cardiac pacemaker: Secondary | ICD-10-CM | POA: Diagnosis not present

## 2021-01-17 DIAGNOSIS — R1310 Dysphagia, unspecified: Secondary | ICD-10-CM | POA: Diagnosis not present

## 2021-01-17 DIAGNOSIS — I480 Paroxysmal atrial fibrillation: Secondary | ICD-10-CM | POA: Diagnosis not present

## 2021-01-19 DIAGNOSIS — I480 Paroxysmal atrial fibrillation: Secondary | ICD-10-CM | POA: Diagnosis not present

## 2021-01-19 DIAGNOSIS — Z950 Presence of cardiac pacemaker: Secondary | ICD-10-CM | POA: Diagnosis not present

## 2021-01-19 DIAGNOSIS — I495 Sick sinus syndrome: Secondary | ICD-10-CM | POA: Diagnosis not present

## 2021-01-19 DIAGNOSIS — G301 Alzheimer's disease with late onset: Secondary | ICD-10-CM | POA: Diagnosis not present

## 2021-01-19 DIAGNOSIS — F0280 Dementia in other diseases classified elsewhere without behavioral disturbance: Secondary | ICD-10-CM | POA: Diagnosis not present

## 2021-01-19 DIAGNOSIS — R1310 Dysphagia, unspecified: Secondary | ICD-10-CM | POA: Diagnosis not present

## 2021-01-22 DIAGNOSIS — I495 Sick sinus syndrome: Secondary | ICD-10-CM | POA: Diagnosis not present

## 2021-01-22 DIAGNOSIS — G301 Alzheimer's disease with late onset: Secondary | ICD-10-CM | POA: Diagnosis not present

## 2021-01-22 DIAGNOSIS — Z950 Presence of cardiac pacemaker: Secondary | ICD-10-CM | POA: Diagnosis not present

## 2021-01-22 DIAGNOSIS — R1310 Dysphagia, unspecified: Secondary | ICD-10-CM | POA: Diagnosis not present

## 2021-01-22 DIAGNOSIS — I480 Paroxysmal atrial fibrillation: Secondary | ICD-10-CM | POA: Diagnosis not present

## 2021-01-22 DIAGNOSIS — F0280 Dementia in other diseases classified elsewhere without behavioral disturbance: Secondary | ICD-10-CM | POA: Diagnosis not present

## 2021-01-24 DIAGNOSIS — I495 Sick sinus syndrome: Secondary | ICD-10-CM | POA: Diagnosis not present

## 2021-01-24 DIAGNOSIS — R1310 Dysphagia, unspecified: Secondary | ICD-10-CM | POA: Diagnosis not present

## 2021-01-24 DIAGNOSIS — F0280 Dementia in other diseases classified elsewhere without behavioral disturbance: Secondary | ICD-10-CM | POA: Diagnosis not present

## 2021-01-24 DIAGNOSIS — I480 Paroxysmal atrial fibrillation: Secondary | ICD-10-CM | POA: Diagnosis not present

## 2021-01-24 DIAGNOSIS — Z950 Presence of cardiac pacemaker: Secondary | ICD-10-CM | POA: Diagnosis not present

## 2021-01-24 DIAGNOSIS — G301 Alzheimer's disease with late onset: Secondary | ICD-10-CM | POA: Diagnosis not present

## 2021-01-25 DIAGNOSIS — G301 Alzheimer's disease with late onset: Secondary | ICD-10-CM | POA: Diagnosis not present

## 2021-01-25 DIAGNOSIS — Z950 Presence of cardiac pacemaker: Secondary | ICD-10-CM | POA: Diagnosis not present

## 2021-01-25 DIAGNOSIS — F0280 Dementia in other diseases classified elsewhere without behavioral disturbance: Secondary | ICD-10-CM | POA: Diagnosis not present

## 2021-01-25 DIAGNOSIS — I480 Paroxysmal atrial fibrillation: Secondary | ICD-10-CM | POA: Diagnosis not present

## 2021-01-25 DIAGNOSIS — I495 Sick sinus syndrome: Secondary | ICD-10-CM | POA: Diagnosis not present

## 2021-01-25 DIAGNOSIS — R1310 Dysphagia, unspecified: Secondary | ICD-10-CM | POA: Diagnosis not present

## 2021-01-26 DIAGNOSIS — R1310 Dysphagia, unspecified: Secondary | ICD-10-CM | POA: Diagnosis not present

## 2021-01-26 DIAGNOSIS — Z950 Presence of cardiac pacemaker: Secondary | ICD-10-CM | POA: Diagnosis not present

## 2021-01-26 DIAGNOSIS — F0280 Dementia in other diseases classified elsewhere without behavioral disturbance: Secondary | ICD-10-CM | POA: Diagnosis not present

## 2021-01-26 DIAGNOSIS — G301 Alzheimer's disease with late onset: Secondary | ICD-10-CM | POA: Diagnosis not present

## 2021-01-26 DIAGNOSIS — I480 Paroxysmal atrial fibrillation: Secondary | ICD-10-CM | POA: Diagnosis not present

## 2021-01-26 DIAGNOSIS — I495 Sick sinus syndrome: Secondary | ICD-10-CM | POA: Diagnosis not present

## 2021-01-29 DIAGNOSIS — G301 Alzheimer's disease with late onset: Secondary | ICD-10-CM | POA: Diagnosis not present

## 2021-01-29 DIAGNOSIS — I480 Paroxysmal atrial fibrillation: Secondary | ICD-10-CM | POA: Diagnosis not present

## 2021-01-29 DIAGNOSIS — F0280 Dementia in other diseases classified elsewhere without behavioral disturbance: Secondary | ICD-10-CM | POA: Diagnosis not present

## 2021-01-29 DIAGNOSIS — Z950 Presence of cardiac pacemaker: Secondary | ICD-10-CM | POA: Diagnosis not present

## 2021-01-29 DIAGNOSIS — I495 Sick sinus syndrome: Secondary | ICD-10-CM | POA: Diagnosis not present

## 2021-01-29 DIAGNOSIS — R1310 Dysphagia, unspecified: Secondary | ICD-10-CM | POA: Diagnosis not present

## 2021-01-30 DIAGNOSIS — F32A Depression, unspecified: Secondary | ICD-10-CM | POA: Diagnosis not present

## 2021-01-30 DIAGNOSIS — J4530 Mild persistent asthma, uncomplicated: Secondary | ICD-10-CM | POA: Diagnosis not present

## 2021-01-30 DIAGNOSIS — G301 Alzheimer's disease with late onset: Secondary | ICD-10-CM | POA: Diagnosis not present

## 2021-01-30 DIAGNOSIS — K219 Gastro-esophageal reflux disease without esophagitis: Secondary | ICD-10-CM | POA: Diagnosis not present

## 2021-01-30 DIAGNOSIS — Z8701 Personal history of pneumonia (recurrent): Secondary | ICD-10-CM | POA: Diagnosis not present

## 2021-01-30 DIAGNOSIS — E441 Mild protein-calorie malnutrition: Secondary | ICD-10-CM | POA: Diagnosis not present

## 2021-01-30 DIAGNOSIS — F419 Anxiety disorder, unspecified: Secondary | ICD-10-CM | POA: Diagnosis not present

## 2021-01-30 DIAGNOSIS — Z950 Presence of cardiac pacemaker: Secondary | ICD-10-CM | POA: Diagnosis not present

## 2021-01-30 DIAGNOSIS — I495 Sick sinus syndrome: Secondary | ICD-10-CM | POA: Diagnosis not present

## 2021-01-30 DIAGNOSIS — R1310 Dysphagia, unspecified: Secondary | ICD-10-CM | POA: Diagnosis not present

## 2021-01-30 DIAGNOSIS — I480 Paroxysmal atrial fibrillation: Secondary | ICD-10-CM | POA: Diagnosis not present

## 2021-01-30 DIAGNOSIS — L89322 Pressure ulcer of left buttock, stage 2: Secondary | ICD-10-CM | POA: Diagnosis not present

## 2021-01-30 DIAGNOSIS — Z8616 Personal history of COVID-19: Secondary | ICD-10-CM | POA: Diagnosis not present

## 2021-01-30 DIAGNOSIS — I10 Essential (primary) hypertension: Secondary | ICD-10-CM | POA: Diagnosis not present

## 2021-01-30 DIAGNOSIS — F0280 Dementia in other diseases classified elsewhere without behavioral disturbance: Secondary | ICD-10-CM | POA: Diagnosis not present

## 2021-01-31 DIAGNOSIS — Z950 Presence of cardiac pacemaker: Secondary | ICD-10-CM | POA: Diagnosis not present

## 2021-01-31 DIAGNOSIS — I480 Paroxysmal atrial fibrillation: Secondary | ICD-10-CM | POA: Diagnosis not present

## 2021-01-31 DIAGNOSIS — F0280 Dementia in other diseases classified elsewhere without behavioral disturbance: Secondary | ICD-10-CM | POA: Diagnosis not present

## 2021-01-31 DIAGNOSIS — G301 Alzheimer's disease with late onset: Secondary | ICD-10-CM | POA: Diagnosis not present

## 2021-01-31 DIAGNOSIS — I495 Sick sinus syndrome: Secondary | ICD-10-CM | POA: Diagnosis not present

## 2021-01-31 DIAGNOSIS — R1310 Dysphagia, unspecified: Secondary | ICD-10-CM | POA: Diagnosis not present

## 2021-02-01 DIAGNOSIS — F0280 Dementia in other diseases classified elsewhere without behavioral disturbance: Secondary | ICD-10-CM | POA: Diagnosis not present

## 2021-02-01 DIAGNOSIS — R1310 Dysphagia, unspecified: Secondary | ICD-10-CM | POA: Diagnosis not present

## 2021-02-01 DIAGNOSIS — Z950 Presence of cardiac pacemaker: Secondary | ICD-10-CM | POA: Diagnosis not present

## 2021-02-01 DIAGNOSIS — G301 Alzheimer's disease with late onset: Secondary | ICD-10-CM | POA: Diagnosis not present

## 2021-02-01 DIAGNOSIS — I495 Sick sinus syndrome: Secondary | ICD-10-CM | POA: Diagnosis not present

## 2021-02-01 DIAGNOSIS — I480 Paroxysmal atrial fibrillation: Secondary | ICD-10-CM | POA: Diagnosis not present

## 2021-02-02 DIAGNOSIS — I495 Sick sinus syndrome: Secondary | ICD-10-CM | POA: Diagnosis not present

## 2021-02-02 DIAGNOSIS — Z950 Presence of cardiac pacemaker: Secondary | ICD-10-CM | POA: Diagnosis not present

## 2021-02-02 DIAGNOSIS — I480 Paroxysmal atrial fibrillation: Secondary | ICD-10-CM | POA: Diagnosis not present

## 2021-02-02 DIAGNOSIS — G301 Alzheimer's disease with late onset: Secondary | ICD-10-CM | POA: Diagnosis not present

## 2021-02-02 DIAGNOSIS — R1310 Dysphagia, unspecified: Secondary | ICD-10-CM | POA: Diagnosis not present

## 2021-02-02 DIAGNOSIS — F0280 Dementia in other diseases classified elsewhere without behavioral disturbance: Secondary | ICD-10-CM | POA: Diagnosis not present

## 2021-02-05 DIAGNOSIS — I480 Paroxysmal atrial fibrillation: Secondary | ICD-10-CM | POA: Diagnosis not present

## 2021-02-05 DIAGNOSIS — G301 Alzheimer's disease with late onset: Secondary | ICD-10-CM | POA: Diagnosis not present

## 2021-02-05 DIAGNOSIS — I495 Sick sinus syndrome: Secondary | ICD-10-CM | POA: Diagnosis not present

## 2021-02-05 DIAGNOSIS — Z950 Presence of cardiac pacemaker: Secondary | ICD-10-CM | POA: Diagnosis not present

## 2021-02-05 DIAGNOSIS — R1310 Dysphagia, unspecified: Secondary | ICD-10-CM | POA: Diagnosis not present

## 2021-02-05 DIAGNOSIS — F0280 Dementia in other diseases classified elsewhere without behavioral disturbance: Secondary | ICD-10-CM | POA: Diagnosis not present

## 2021-02-06 DIAGNOSIS — G301 Alzheimer's disease with late onset: Secondary | ICD-10-CM | POA: Diagnosis not present

## 2021-02-06 DIAGNOSIS — I480 Paroxysmal atrial fibrillation: Secondary | ICD-10-CM | POA: Diagnosis not present

## 2021-02-06 DIAGNOSIS — Z950 Presence of cardiac pacemaker: Secondary | ICD-10-CM | POA: Diagnosis not present

## 2021-02-06 DIAGNOSIS — R1310 Dysphagia, unspecified: Secondary | ICD-10-CM | POA: Diagnosis not present

## 2021-02-06 DIAGNOSIS — I495 Sick sinus syndrome: Secondary | ICD-10-CM | POA: Diagnosis not present

## 2021-02-06 DIAGNOSIS — F0280 Dementia in other diseases classified elsewhere without behavioral disturbance: Secondary | ICD-10-CM | POA: Diagnosis not present

## 2021-02-07 DIAGNOSIS — G301 Alzheimer's disease with late onset: Secondary | ICD-10-CM | POA: Diagnosis not present

## 2021-02-07 DIAGNOSIS — I480 Paroxysmal atrial fibrillation: Secondary | ICD-10-CM | POA: Diagnosis not present

## 2021-02-07 DIAGNOSIS — F0280 Dementia in other diseases classified elsewhere without behavioral disturbance: Secondary | ICD-10-CM | POA: Diagnosis not present

## 2021-02-07 DIAGNOSIS — Z950 Presence of cardiac pacemaker: Secondary | ICD-10-CM | POA: Diagnosis not present

## 2021-02-07 DIAGNOSIS — I495 Sick sinus syndrome: Secondary | ICD-10-CM | POA: Diagnosis not present

## 2021-02-07 DIAGNOSIS — R1310 Dysphagia, unspecified: Secondary | ICD-10-CM | POA: Diagnosis not present

## 2021-02-08 DIAGNOSIS — R1310 Dysphagia, unspecified: Secondary | ICD-10-CM | POA: Diagnosis not present

## 2021-02-08 DIAGNOSIS — I495 Sick sinus syndrome: Secondary | ICD-10-CM | POA: Diagnosis not present

## 2021-02-08 DIAGNOSIS — G301 Alzheimer's disease with late onset: Secondary | ICD-10-CM | POA: Diagnosis not present

## 2021-02-08 DIAGNOSIS — Z950 Presence of cardiac pacemaker: Secondary | ICD-10-CM | POA: Diagnosis not present

## 2021-02-08 DIAGNOSIS — F0280 Dementia in other diseases classified elsewhere without behavioral disturbance: Secondary | ICD-10-CM | POA: Diagnosis not present

## 2021-02-08 DIAGNOSIS — I480 Paroxysmal atrial fibrillation: Secondary | ICD-10-CM | POA: Diagnosis not present

## 2021-02-09 DIAGNOSIS — F0280 Dementia in other diseases classified elsewhere without behavioral disturbance: Secondary | ICD-10-CM | POA: Diagnosis not present

## 2021-02-09 DIAGNOSIS — R1310 Dysphagia, unspecified: Secondary | ICD-10-CM | POA: Diagnosis not present

## 2021-02-09 DIAGNOSIS — G301 Alzheimer's disease with late onset: Secondary | ICD-10-CM | POA: Diagnosis not present

## 2021-02-09 DIAGNOSIS — I480 Paroxysmal atrial fibrillation: Secondary | ICD-10-CM | POA: Diagnosis not present

## 2021-02-09 DIAGNOSIS — Z950 Presence of cardiac pacemaker: Secondary | ICD-10-CM | POA: Diagnosis not present

## 2021-02-09 DIAGNOSIS — I495 Sick sinus syndrome: Secondary | ICD-10-CM | POA: Diagnosis not present

## 2021-02-10 DIAGNOSIS — F0280 Dementia in other diseases classified elsewhere without behavioral disturbance: Secondary | ICD-10-CM | POA: Diagnosis not present

## 2021-02-10 DIAGNOSIS — I495 Sick sinus syndrome: Secondary | ICD-10-CM | POA: Diagnosis not present

## 2021-02-10 DIAGNOSIS — G301 Alzheimer's disease with late onset: Secondary | ICD-10-CM | POA: Diagnosis not present

## 2021-02-10 DIAGNOSIS — R1310 Dysphagia, unspecified: Secondary | ICD-10-CM | POA: Diagnosis not present

## 2021-02-10 DIAGNOSIS — Z950 Presence of cardiac pacemaker: Secondary | ICD-10-CM | POA: Diagnosis not present

## 2021-02-10 DIAGNOSIS — I480 Paroxysmal atrial fibrillation: Secondary | ICD-10-CM | POA: Diagnosis not present

## 2021-02-11 DIAGNOSIS — I495 Sick sinus syndrome: Secondary | ICD-10-CM | POA: Diagnosis not present

## 2021-02-11 DIAGNOSIS — G301 Alzheimer's disease with late onset: Secondary | ICD-10-CM | POA: Diagnosis not present

## 2021-02-11 DIAGNOSIS — F0280 Dementia in other diseases classified elsewhere without behavioral disturbance: Secondary | ICD-10-CM | POA: Diagnosis not present

## 2021-02-11 DIAGNOSIS — I480 Paroxysmal atrial fibrillation: Secondary | ICD-10-CM | POA: Diagnosis not present

## 2021-02-11 DIAGNOSIS — Z950 Presence of cardiac pacemaker: Secondary | ICD-10-CM | POA: Diagnosis not present

## 2021-02-11 DIAGNOSIS — R1310 Dysphagia, unspecified: Secondary | ICD-10-CM | POA: Diagnosis not present

## 2021-02-12 DIAGNOSIS — R1310 Dysphagia, unspecified: Secondary | ICD-10-CM | POA: Diagnosis not present

## 2021-02-12 DIAGNOSIS — I480 Paroxysmal atrial fibrillation: Secondary | ICD-10-CM | POA: Diagnosis not present

## 2021-02-12 DIAGNOSIS — F0280 Dementia in other diseases classified elsewhere without behavioral disturbance: Secondary | ICD-10-CM | POA: Diagnosis not present

## 2021-02-12 DIAGNOSIS — Z950 Presence of cardiac pacemaker: Secondary | ICD-10-CM | POA: Diagnosis not present

## 2021-02-12 DIAGNOSIS — G301 Alzheimer's disease with late onset: Secondary | ICD-10-CM | POA: Diagnosis not present

## 2021-02-12 DIAGNOSIS — I495 Sick sinus syndrome: Secondary | ICD-10-CM | POA: Diagnosis not present

## 2021-02-13 DIAGNOSIS — G301 Alzheimer's disease with late onset: Secondary | ICD-10-CM | POA: Diagnosis not present

## 2021-02-13 DIAGNOSIS — I495 Sick sinus syndrome: Secondary | ICD-10-CM | POA: Diagnosis not present

## 2021-02-13 DIAGNOSIS — R1310 Dysphagia, unspecified: Secondary | ICD-10-CM | POA: Diagnosis not present

## 2021-02-13 DIAGNOSIS — I480 Paroxysmal atrial fibrillation: Secondary | ICD-10-CM | POA: Diagnosis not present

## 2021-02-13 DIAGNOSIS — Z950 Presence of cardiac pacemaker: Secondary | ICD-10-CM | POA: Diagnosis not present

## 2021-02-13 DIAGNOSIS — F0280 Dementia in other diseases classified elsewhere without behavioral disturbance: Secondary | ICD-10-CM | POA: Diagnosis not present

## 2021-02-14 DIAGNOSIS — I480 Paroxysmal atrial fibrillation: Secondary | ICD-10-CM | POA: Diagnosis not present

## 2021-02-14 DIAGNOSIS — G301 Alzheimer's disease with late onset: Secondary | ICD-10-CM | POA: Diagnosis not present

## 2021-02-14 DIAGNOSIS — I495 Sick sinus syndrome: Secondary | ICD-10-CM | POA: Diagnosis not present

## 2021-02-14 DIAGNOSIS — R1310 Dysphagia, unspecified: Secondary | ICD-10-CM | POA: Diagnosis not present

## 2021-02-14 DIAGNOSIS — F0280 Dementia in other diseases classified elsewhere without behavioral disturbance: Secondary | ICD-10-CM | POA: Diagnosis not present

## 2021-02-14 DIAGNOSIS — Z950 Presence of cardiac pacemaker: Secondary | ICD-10-CM | POA: Diagnosis not present

## 2021-02-16 DIAGNOSIS — I480 Paroxysmal atrial fibrillation: Secondary | ICD-10-CM | POA: Diagnosis not present

## 2021-02-16 DIAGNOSIS — I495 Sick sinus syndrome: Secondary | ICD-10-CM | POA: Diagnosis not present

## 2021-02-16 DIAGNOSIS — Z950 Presence of cardiac pacemaker: Secondary | ICD-10-CM | POA: Diagnosis not present

## 2021-02-16 DIAGNOSIS — F0280 Dementia in other diseases classified elsewhere without behavioral disturbance: Secondary | ICD-10-CM | POA: Diagnosis not present

## 2021-02-16 DIAGNOSIS — G301 Alzheimer's disease with late onset: Secondary | ICD-10-CM | POA: Diagnosis not present

## 2021-02-16 DIAGNOSIS — R1310 Dysphagia, unspecified: Secondary | ICD-10-CM | POA: Diagnosis not present

## 2021-02-19 DIAGNOSIS — R1310 Dysphagia, unspecified: Secondary | ICD-10-CM | POA: Diagnosis not present

## 2021-02-19 DIAGNOSIS — F0280 Dementia in other diseases classified elsewhere without behavioral disturbance: Secondary | ICD-10-CM | POA: Diagnosis not present

## 2021-02-19 DIAGNOSIS — G301 Alzheimer's disease with late onset: Secondary | ICD-10-CM | POA: Diagnosis not present

## 2021-02-19 DIAGNOSIS — I480 Paroxysmal atrial fibrillation: Secondary | ICD-10-CM | POA: Diagnosis not present

## 2021-02-19 DIAGNOSIS — Z950 Presence of cardiac pacemaker: Secondary | ICD-10-CM | POA: Diagnosis not present

## 2021-02-19 DIAGNOSIS — I495 Sick sinus syndrome: Secondary | ICD-10-CM | POA: Diagnosis not present

## 2021-02-21 DIAGNOSIS — I495 Sick sinus syndrome: Secondary | ICD-10-CM | POA: Diagnosis not present

## 2021-02-21 DIAGNOSIS — R1310 Dysphagia, unspecified: Secondary | ICD-10-CM | POA: Diagnosis not present

## 2021-02-21 DIAGNOSIS — F0280 Dementia in other diseases classified elsewhere without behavioral disturbance: Secondary | ICD-10-CM | POA: Diagnosis not present

## 2021-02-21 DIAGNOSIS — I480 Paroxysmal atrial fibrillation: Secondary | ICD-10-CM | POA: Diagnosis not present

## 2021-02-21 DIAGNOSIS — G301 Alzheimer's disease with late onset: Secondary | ICD-10-CM | POA: Diagnosis not present

## 2021-02-21 DIAGNOSIS — Z950 Presence of cardiac pacemaker: Secondary | ICD-10-CM | POA: Diagnosis not present

## 2021-02-23 DIAGNOSIS — F0280 Dementia in other diseases classified elsewhere without behavioral disturbance: Secondary | ICD-10-CM | POA: Diagnosis not present

## 2021-02-23 DIAGNOSIS — G301 Alzheimer's disease with late onset: Secondary | ICD-10-CM | POA: Diagnosis not present

## 2021-02-23 DIAGNOSIS — Z950 Presence of cardiac pacemaker: Secondary | ICD-10-CM | POA: Diagnosis not present

## 2021-02-23 DIAGNOSIS — I495 Sick sinus syndrome: Secondary | ICD-10-CM | POA: Diagnosis not present

## 2021-02-23 DIAGNOSIS — I480 Paroxysmal atrial fibrillation: Secondary | ICD-10-CM | POA: Diagnosis not present

## 2021-02-23 DIAGNOSIS — R1310 Dysphagia, unspecified: Secondary | ICD-10-CM | POA: Diagnosis not present

## 2021-02-26 DIAGNOSIS — Z950 Presence of cardiac pacemaker: Secondary | ICD-10-CM | POA: Diagnosis not present

## 2021-02-26 DIAGNOSIS — I495 Sick sinus syndrome: Secondary | ICD-10-CM | POA: Diagnosis not present

## 2021-02-26 DIAGNOSIS — G301 Alzheimer's disease with late onset: Secondary | ICD-10-CM | POA: Diagnosis not present

## 2021-02-26 DIAGNOSIS — I480 Paroxysmal atrial fibrillation: Secondary | ICD-10-CM | POA: Diagnosis not present

## 2021-02-26 DIAGNOSIS — R1310 Dysphagia, unspecified: Secondary | ICD-10-CM | POA: Diagnosis not present

## 2021-02-26 DIAGNOSIS — F0280 Dementia in other diseases classified elsewhere without behavioral disturbance: Secondary | ICD-10-CM | POA: Diagnosis not present

## 2021-02-28 DIAGNOSIS — I495 Sick sinus syndrome: Secondary | ICD-10-CM | POA: Diagnosis not present

## 2021-02-28 DIAGNOSIS — F0280 Dementia in other diseases classified elsewhere without behavioral disturbance: Secondary | ICD-10-CM | POA: Diagnosis not present

## 2021-02-28 DIAGNOSIS — I480 Paroxysmal atrial fibrillation: Secondary | ICD-10-CM | POA: Diagnosis not present

## 2021-02-28 DIAGNOSIS — Z950 Presence of cardiac pacemaker: Secondary | ICD-10-CM | POA: Diagnosis not present

## 2021-02-28 DIAGNOSIS — R1310 Dysphagia, unspecified: Secondary | ICD-10-CM | POA: Diagnosis not present

## 2021-02-28 DIAGNOSIS — G301 Alzheimer's disease with late onset: Secondary | ICD-10-CM | POA: Diagnosis not present

## 2021-03-01 DIAGNOSIS — Z8616 Personal history of COVID-19: Secondary | ICD-10-CM | POA: Diagnosis not present

## 2021-03-01 DIAGNOSIS — F419 Anxiety disorder, unspecified: Secondary | ICD-10-CM | POA: Diagnosis not present

## 2021-03-01 DIAGNOSIS — I495 Sick sinus syndrome: Secondary | ICD-10-CM | POA: Diagnosis not present

## 2021-03-01 DIAGNOSIS — E441 Mild protein-calorie malnutrition: Secondary | ICD-10-CM | POA: Diagnosis not present

## 2021-03-01 DIAGNOSIS — R1310 Dysphagia, unspecified: Secondary | ICD-10-CM | POA: Diagnosis not present

## 2021-03-01 DIAGNOSIS — J4530 Mild persistent asthma, uncomplicated: Secondary | ICD-10-CM | POA: Diagnosis not present

## 2021-03-01 DIAGNOSIS — F0280 Dementia in other diseases classified elsewhere without behavioral disturbance: Secondary | ICD-10-CM | POA: Diagnosis not present

## 2021-03-01 DIAGNOSIS — Z8701 Personal history of pneumonia (recurrent): Secondary | ICD-10-CM | POA: Diagnosis not present

## 2021-03-01 DIAGNOSIS — L89322 Pressure ulcer of left buttock, stage 2: Secondary | ICD-10-CM | POA: Diagnosis not present

## 2021-03-01 DIAGNOSIS — G301 Alzheimer's disease with late onset: Secondary | ICD-10-CM | POA: Diagnosis not present

## 2021-03-01 DIAGNOSIS — I480 Paroxysmal atrial fibrillation: Secondary | ICD-10-CM | POA: Diagnosis not present

## 2021-03-01 DIAGNOSIS — I10 Essential (primary) hypertension: Secondary | ICD-10-CM | POA: Diagnosis not present

## 2021-03-01 DIAGNOSIS — Z950 Presence of cardiac pacemaker: Secondary | ICD-10-CM | POA: Diagnosis not present

## 2021-03-01 DIAGNOSIS — K219 Gastro-esophageal reflux disease without esophagitis: Secondary | ICD-10-CM | POA: Diagnosis not present

## 2021-03-01 DIAGNOSIS — F32A Depression, unspecified: Secondary | ICD-10-CM | POA: Diagnosis not present

## 2021-03-02 DIAGNOSIS — F0280 Dementia in other diseases classified elsewhere without behavioral disturbance: Secondary | ICD-10-CM | POA: Diagnosis not present

## 2021-03-02 DIAGNOSIS — Z950 Presence of cardiac pacemaker: Secondary | ICD-10-CM | POA: Diagnosis not present

## 2021-03-02 DIAGNOSIS — R1310 Dysphagia, unspecified: Secondary | ICD-10-CM | POA: Diagnosis not present

## 2021-03-02 DIAGNOSIS — G301 Alzheimer's disease with late onset: Secondary | ICD-10-CM | POA: Diagnosis not present

## 2021-03-02 DIAGNOSIS — I495 Sick sinus syndrome: Secondary | ICD-10-CM | POA: Diagnosis not present

## 2021-03-02 DIAGNOSIS — I480 Paroxysmal atrial fibrillation: Secondary | ICD-10-CM | POA: Diagnosis not present

## 2021-03-05 DIAGNOSIS — R1310 Dysphagia, unspecified: Secondary | ICD-10-CM | POA: Diagnosis not present

## 2021-03-05 DIAGNOSIS — G301 Alzheimer's disease with late onset: Secondary | ICD-10-CM | POA: Diagnosis not present

## 2021-03-05 DIAGNOSIS — I480 Paroxysmal atrial fibrillation: Secondary | ICD-10-CM | POA: Diagnosis not present

## 2021-03-05 DIAGNOSIS — F0280 Dementia in other diseases classified elsewhere without behavioral disturbance: Secondary | ICD-10-CM | POA: Diagnosis not present

## 2021-03-05 DIAGNOSIS — I495 Sick sinus syndrome: Secondary | ICD-10-CM | POA: Diagnosis not present

## 2021-03-05 DIAGNOSIS — Z950 Presence of cardiac pacemaker: Secondary | ICD-10-CM | POA: Diagnosis not present

## 2021-03-07 DIAGNOSIS — Z950 Presence of cardiac pacemaker: Secondary | ICD-10-CM | POA: Diagnosis not present

## 2021-03-07 DIAGNOSIS — I480 Paroxysmal atrial fibrillation: Secondary | ICD-10-CM | POA: Diagnosis not present

## 2021-03-07 DIAGNOSIS — R1310 Dysphagia, unspecified: Secondary | ICD-10-CM | POA: Diagnosis not present

## 2021-03-07 DIAGNOSIS — I495 Sick sinus syndrome: Secondary | ICD-10-CM | POA: Diagnosis not present

## 2021-03-07 DIAGNOSIS — G301 Alzheimer's disease with late onset: Secondary | ICD-10-CM | POA: Diagnosis not present

## 2021-03-07 DIAGNOSIS — F0280 Dementia in other diseases classified elsewhere without behavioral disturbance: Secondary | ICD-10-CM | POA: Diagnosis not present

## 2021-03-08 DIAGNOSIS — I480 Paroxysmal atrial fibrillation: Secondary | ICD-10-CM | POA: Diagnosis not present

## 2021-03-08 DIAGNOSIS — G301 Alzheimer's disease with late onset: Secondary | ICD-10-CM | POA: Diagnosis not present

## 2021-03-08 DIAGNOSIS — I495 Sick sinus syndrome: Secondary | ICD-10-CM | POA: Diagnosis not present

## 2021-03-08 DIAGNOSIS — F0280 Dementia in other diseases classified elsewhere without behavioral disturbance: Secondary | ICD-10-CM | POA: Diagnosis not present

## 2021-03-08 DIAGNOSIS — R1310 Dysphagia, unspecified: Secondary | ICD-10-CM | POA: Diagnosis not present

## 2021-03-08 DIAGNOSIS — Z950 Presence of cardiac pacemaker: Secondary | ICD-10-CM | POA: Diagnosis not present

## 2021-03-09 DIAGNOSIS — G301 Alzheimer's disease with late onset: Secondary | ICD-10-CM | POA: Diagnosis not present

## 2021-03-09 DIAGNOSIS — I480 Paroxysmal atrial fibrillation: Secondary | ICD-10-CM | POA: Diagnosis not present

## 2021-03-09 DIAGNOSIS — I495 Sick sinus syndrome: Secondary | ICD-10-CM | POA: Diagnosis not present

## 2021-03-09 DIAGNOSIS — F0280 Dementia in other diseases classified elsewhere without behavioral disturbance: Secondary | ICD-10-CM | POA: Diagnosis not present

## 2021-03-09 DIAGNOSIS — Z950 Presence of cardiac pacemaker: Secondary | ICD-10-CM | POA: Diagnosis not present

## 2021-03-09 DIAGNOSIS — R1310 Dysphagia, unspecified: Secondary | ICD-10-CM | POA: Diagnosis not present

## 2021-03-12 DIAGNOSIS — F0280 Dementia in other diseases classified elsewhere without behavioral disturbance: Secondary | ICD-10-CM | POA: Diagnosis not present

## 2021-03-12 DIAGNOSIS — Z950 Presence of cardiac pacemaker: Secondary | ICD-10-CM | POA: Diagnosis not present

## 2021-03-12 DIAGNOSIS — I495 Sick sinus syndrome: Secondary | ICD-10-CM | POA: Diagnosis not present

## 2021-03-12 DIAGNOSIS — G301 Alzheimer's disease with late onset: Secondary | ICD-10-CM | POA: Diagnosis not present

## 2021-03-12 DIAGNOSIS — R1310 Dysphagia, unspecified: Secondary | ICD-10-CM | POA: Diagnosis not present

## 2021-03-12 DIAGNOSIS — I480 Paroxysmal atrial fibrillation: Secondary | ICD-10-CM | POA: Diagnosis not present

## 2021-03-14 DIAGNOSIS — R1310 Dysphagia, unspecified: Secondary | ICD-10-CM | POA: Diagnosis not present

## 2021-03-14 DIAGNOSIS — I495 Sick sinus syndrome: Secondary | ICD-10-CM | POA: Diagnosis not present

## 2021-03-14 DIAGNOSIS — G301 Alzheimer's disease with late onset: Secondary | ICD-10-CM | POA: Diagnosis not present

## 2021-03-14 DIAGNOSIS — I480 Paroxysmal atrial fibrillation: Secondary | ICD-10-CM | POA: Diagnosis not present

## 2021-03-14 DIAGNOSIS — F0280 Dementia in other diseases classified elsewhere without behavioral disturbance: Secondary | ICD-10-CM | POA: Diagnosis not present

## 2021-03-14 DIAGNOSIS — Z950 Presence of cardiac pacemaker: Secondary | ICD-10-CM | POA: Diagnosis not present

## 2021-03-16 DIAGNOSIS — I480 Paroxysmal atrial fibrillation: Secondary | ICD-10-CM | POA: Diagnosis not present

## 2021-03-16 DIAGNOSIS — G301 Alzheimer's disease with late onset: Secondary | ICD-10-CM | POA: Diagnosis not present

## 2021-03-16 DIAGNOSIS — F0280 Dementia in other diseases classified elsewhere without behavioral disturbance: Secondary | ICD-10-CM | POA: Diagnosis not present

## 2021-03-16 DIAGNOSIS — I495 Sick sinus syndrome: Secondary | ICD-10-CM | POA: Diagnosis not present

## 2021-03-16 DIAGNOSIS — R1310 Dysphagia, unspecified: Secondary | ICD-10-CM | POA: Diagnosis not present

## 2021-03-16 DIAGNOSIS — Z950 Presence of cardiac pacemaker: Secondary | ICD-10-CM | POA: Diagnosis not present

## 2021-03-19 DIAGNOSIS — R1310 Dysphagia, unspecified: Secondary | ICD-10-CM | POA: Diagnosis not present

## 2021-03-19 DIAGNOSIS — I495 Sick sinus syndrome: Secondary | ICD-10-CM | POA: Diagnosis not present

## 2021-03-19 DIAGNOSIS — I480 Paroxysmal atrial fibrillation: Secondary | ICD-10-CM | POA: Diagnosis not present

## 2021-03-19 DIAGNOSIS — Z950 Presence of cardiac pacemaker: Secondary | ICD-10-CM | POA: Diagnosis not present

## 2021-03-19 DIAGNOSIS — F0280 Dementia in other diseases classified elsewhere without behavioral disturbance: Secondary | ICD-10-CM | POA: Diagnosis not present

## 2021-03-19 DIAGNOSIS — G301 Alzheimer's disease with late onset: Secondary | ICD-10-CM | POA: Diagnosis not present

## 2021-03-21 DIAGNOSIS — Z950 Presence of cardiac pacemaker: Secondary | ICD-10-CM | POA: Diagnosis not present

## 2021-03-21 DIAGNOSIS — I495 Sick sinus syndrome: Secondary | ICD-10-CM | POA: Diagnosis not present

## 2021-03-21 DIAGNOSIS — F0280 Dementia in other diseases classified elsewhere without behavioral disturbance: Secondary | ICD-10-CM | POA: Diagnosis not present

## 2021-03-21 DIAGNOSIS — I480 Paroxysmal atrial fibrillation: Secondary | ICD-10-CM | POA: Diagnosis not present

## 2021-03-21 DIAGNOSIS — G301 Alzheimer's disease with late onset: Secondary | ICD-10-CM | POA: Diagnosis not present

## 2021-03-21 DIAGNOSIS — R1310 Dysphagia, unspecified: Secondary | ICD-10-CM | POA: Diagnosis not present

## 2021-03-23 DIAGNOSIS — G301 Alzheimer's disease with late onset: Secondary | ICD-10-CM | POA: Diagnosis not present

## 2021-03-23 DIAGNOSIS — I480 Paroxysmal atrial fibrillation: Secondary | ICD-10-CM | POA: Diagnosis not present

## 2021-03-23 DIAGNOSIS — R1310 Dysphagia, unspecified: Secondary | ICD-10-CM | POA: Diagnosis not present

## 2021-03-23 DIAGNOSIS — F0280 Dementia in other diseases classified elsewhere without behavioral disturbance: Secondary | ICD-10-CM | POA: Diagnosis not present

## 2021-03-23 DIAGNOSIS — I495 Sick sinus syndrome: Secondary | ICD-10-CM | POA: Diagnosis not present

## 2021-03-23 DIAGNOSIS — Z950 Presence of cardiac pacemaker: Secondary | ICD-10-CM | POA: Diagnosis not present

## 2021-03-26 DIAGNOSIS — Z950 Presence of cardiac pacemaker: Secondary | ICD-10-CM | POA: Diagnosis not present

## 2021-03-26 DIAGNOSIS — I480 Paroxysmal atrial fibrillation: Secondary | ICD-10-CM | POA: Diagnosis not present

## 2021-03-26 DIAGNOSIS — F0280 Dementia in other diseases classified elsewhere without behavioral disturbance: Secondary | ICD-10-CM | POA: Diagnosis not present

## 2021-03-26 DIAGNOSIS — G301 Alzheimer's disease with late onset: Secondary | ICD-10-CM | POA: Diagnosis not present

## 2021-03-26 DIAGNOSIS — I495 Sick sinus syndrome: Secondary | ICD-10-CM | POA: Diagnosis not present

## 2021-03-26 DIAGNOSIS — R1310 Dysphagia, unspecified: Secondary | ICD-10-CM | POA: Diagnosis not present

## 2021-03-28 DIAGNOSIS — I480 Paroxysmal atrial fibrillation: Secondary | ICD-10-CM | POA: Diagnosis not present

## 2021-03-28 DIAGNOSIS — I495 Sick sinus syndrome: Secondary | ICD-10-CM | POA: Diagnosis not present

## 2021-03-28 DIAGNOSIS — Z950 Presence of cardiac pacemaker: Secondary | ICD-10-CM | POA: Diagnosis not present

## 2021-03-28 DIAGNOSIS — G301 Alzheimer's disease with late onset: Secondary | ICD-10-CM | POA: Diagnosis not present

## 2021-03-28 DIAGNOSIS — R1310 Dysphagia, unspecified: Secondary | ICD-10-CM | POA: Diagnosis not present

## 2021-03-28 DIAGNOSIS — F0280 Dementia in other diseases classified elsewhere without behavioral disturbance: Secondary | ICD-10-CM | POA: Diagnosis not present

## 2021-03-29 DIAGNOSIS — Z950 Presence of cardiac pacemaker: Secondary | ICD-10-CM | POA: Diagnosis not present

## 2021-03-29 DIAGNOSIS — I480 Paroxysmal atrial fibrillation: Secondary | ICD-10-CM | POA: Diagnosis not present

## 2021-03-29 DIAGNOSIS — G301 Alzheimer's disease with late onset: Secondary | ICD-10-CM | POA: Diagnosis not present

## 2021-03-29 DIAGNOSIS — F0280 Dementia in other diseases classified elsewhere without behavioral disturbance: Secondary | ICD-10-CM | POA: Diagnosis not present

## 2021-03-29 DIAGNOSIS — R1310 Dysphagia, unspecified: Secondary | ICD-10-CM | POA: Diagnosis not present

## 2021-03-29 DIAGNOSIS — I495 Sick sinus syndrome: Secondary | ICD-10-CM | POA: Diagnosis not present

## 2021-03-30 DIAGNOSIS — R1310 Dysphagia, unspecified: Secondary | ICD-10-CM | POA: Diagnosis not present

## 2021-03-30 DIAGNOSIS — I480 Paroxysmal atrial fibrillation: Secondary | ICD-10-CM | POA: Diagnosis not present

## 2021-03-30 DIAGNOSIS — F0280 Dementia in other diseases classified elsewhere without behavioral disturbance: Secondary | ICD-10-CM | POA: Diagnosis not present

## 2021-03-30 DIAGNOSIS — Z950 Presence of cardiac pacemaker: Secondary | ICD-10-CM | POA: Diagnosis not present

## 2021-03-30 DIAGNOSIS — I495 Sick sinus syndrome: Secondary | ICD-10-CM | POA: Diagnosis not present

## 2021-03-30 DIAGNOSIS — G301 Alzheimer's disease with late onset: Secondary | ICD-10-CM | POA: Diagnosis not present

## 2021-04-01 DIAGNOSIS — K219 Gastro-esophageal reflux disease without esophagitis: Secondary | ICD-10-CM | POA: Diagnosis not present

## 2021-04-01 DIAGNOSIS — I480 Paroxysmal atrial fibrillation: Secondary | ICD-10-CM | POA: Diagnosis not present

## 2021-04-01 DIAGNOSIS — E441 Mild protein-calorie malnutrition: Secondary | ICD-10-CM | POA: Diagnosis not present

## 2021-04-01 DIAGNOSIS — F419 Anxiety disorder, unspecified: Secondary | ICD-10-CM | POA: Diagnosis not present

## 2021-04-01 DIAGNOSIS — Z950 Presence of cardiac pacemaker: Secondary | ICD-10-CM | POA: Diagnosis not present

## 2021-04-01 DIAGNOSIS — Z8701 Personal history of pneumonia (recurrent): Secondary | ICD-10-CM | POA: Diagnosis not present

## 2021-04-01 DIAGNOSIS — F32A Depression, unspecified: Secondary | ICD-10-CM | POA: Diagnosis not present

## 2021-04-01 DIAGNOSIS — Z8616 Personal history of COVID-19: Secondary | ICD-10-CM | POA: Diagnosis not present

## 2021-04-01 DIAGNOSIS — G301 Alzheimer's disease with late onset: Secondary | ICD-10-CM | POA: Diagnosis not present

## 2021-04-01 DIAGNOSIS — F0280 Dementia in other diseases classified elsewhere without behavioral disturbance: Secondary | ICD-10-CM | POA: Diagnosis not present

## 2021-04-01 DIAGNOSIS — I495 Sick sinus syndrome: Secondary | ICD-10-CM | POA: Diagnosis not present

## 2021-04-01 DIAGNOSIS — R1310 Dysphagia, unspecified: Secondary | ICD-10-CM | POA: Diagnosis not present

## 2021-04-01 DIAGNOSIS — I10 Essential (primary) hypertension: Secondary | ICD-10-CM | POA: Diagnosis not present

## 2021-04-01 DIAGNOSIS — J4530 Mild persistent asthma, uncomplicated: Secondary | ICD-10-CM | POA: Diagnosis not present

## 2021-04-02 DIAGNOSIS — F0280 Dementia in other diseases classified elsewhere without behavioral disturbance: Secondary | ICD-10-CM | POA: Diagnosis not present

## 2021-04-02 DIAGNOSIS — I495 Sick sinus syndrome: Secondary | ICD-10-CM | POA: Diagnosis not present

## 2021-04-02 DIAGNOSIS — R1310 Dysphagia, unspecified: Secondary | ICD-10-CM | POA: Diagnosis not present

## 2021-04-02 DIAGNOSIS — G301 Alzheimer's disease with late onset: Secondary | ICD-10-CM | POA: Diagnosis not present

## 2021-04-02 DIAGNOSIS — Z950 Presence of cardiac pacemaker: Secondary | ICD-10-CM | POA: Diagnosis not present

## 2021-04-02 DIAGNOSIS — I480 Paroxysmal atrial fibrillation: Secondary | ICD-10-CM | POA: Diagnosis not present

## 2021-04-03 DIAGNOSIS — G301 Alzheimer's disease with late onset: Secondary | ICD-10-CM | POA: Diagnosis not present

## 2021-04-03 DIAGNOSIS — I480 Paroxysmal atrial fibrillation: Secondary | ICD-10-CM | POA: Diagnosis not present

## 2021-04-03 DIAGNOSIS — R1310 Dysphagia, unspecified: Secondary | ICD-10-CM | POA: Diagnosis not present

## 2021-04-03 DIAGNOSIS — Z950 Presence of cardiac pacemaker: Secondary | ICD-10-CM | POA: Diagnosis not present

## 2021-04-03 DIAGNOSIS — F0280 Dementia in other diseases classified elsewhere without behavioral disturbance: Secondary | ICD-10-CM | POA: Diagnosis not present

## 2021-04-03 DIAGNOSIS — I495 Sick sinus syndrome: Secondary | ICD-10-CM | POA: Diagnosis not present

## 2021-04-04 DIAGNOSIS — F0280 Dementia in other diseases classified elsewhere without behavioral disturbance: Secondary | ICD-10-CM | POA: Diagnosis not present

## 2021-04-04 DIAGNOSIS — G301 Alzheimer's disease with late onset: Secondary | ICD-10-CM | POA: Diagnosis not present

## 2021-04-04 DIAGNOSIS — Z950 Presence of cardiac pacemaker: Secondary | ICD-10-CM | POA: Diagnosis not present

## 2021-04-04 DIAGNOSIS — I495 Sick sinus syndrome: Secondary | ICD-10-CM | POA: Diagnosis not present

## 2021-04-04 DIAGNOSIS — R1310 Dysphagia, unspecified: Secondary | ICD-10-CM | POA: Diagnosis not present

## 2021-04-04 DIAGNOSIS — I480 Paroxysmal atrial fibrillation: Secondary | ICD-10-CM | POA: Diagnosis not present

## 2021-04-05 DIAGNOSIS — R1310 Dysphagia, unspecified: Secondary | ICD-10-CM | POA: Diagnosis not present

## 2021-04-05 DIAGNOSIS — I495 Sick sinus syndrome: Secondary | ICD-10-CM | POA: Diagnosis not present

## 2021-04-05 DIAGNOSIS — Z950 Presence of cardiac pacemaker: Secondary | ICD-10-CM | POA: Diagnosis not present

## 2021-04-05 DIAGNOSIS — I480 Paroxysmal atrial fibrillation: Secondary | ICD-10-CM | POA: Diagnosis not present

## 2021-04-05 DIAGNOSIS — F0280 Dementia in other diseases classified elsewhere without behavioral disturbance: Secondary | ICD-10-CM | POA: Diagnosis not present

## 2021-04-05 DIAGNOSIS — G301 Alzheimer's disease with late onset: Secondary | ICD-10-CM | POA: Diagnosis not present

## 2021-04-06 DIAGNOSIS — I495 Sick sinus syndrome: Secondary | ICD-10-CM | POA: Diagnosis not present

## 2021-04-06 DIAGNOSIS — G301 Alzheimer's disease with late onset: Secondary | ICD-10-CM | POA: Diagnosis not present

## 2021-04-06 DIAGNOSIS — I480 Paroxysmal atrial fibrillation: Secondary | ICD-10-CM | POA: Diagnosis not present

## 2021-04-06 DIAGNOSIS — F0280 Dementia in other diseases classified elsewhere without behavioral disturbance: Secondary | ICD-10-CM | POA: Diagnosis not present

## 2021-04-06 DIAGNOSIS — R1310 Dysphagia, unspecified: Secondary | ICD-10-CM | POA: Diagnosis not present

## 2021-04-06 DIAGNOSIS — Z950 Presence of cardiac pacemaker: Secondary | ICD-10-CM | POA: Diagnosis not present

## 2021-04-07 DIAGNOSIS — Z950 Presence of cardiac pacemaker: Secondary | ICD-10-CM | POA: Diagnosis not present

## 2021-04-07 DIAGNOSIS — I495 Sick sinus syndrome: Secondary | ICD-10-CM | POA: Diagnosis not present

## 2021-04-07 DIAGNOSIS — G301 Alzheimer's disease with late onset: Secondary | ICD-10-CM | POA: Diagnosis not present

## 2021-04-07 DIAGNOSIS — F0280 Dementia in other diseases classified elsewhere without behavioral disturbance: Secondary | ICD-10-CM | POA: Diagnosis not present

## 2021-04-07 DIAGNOSIS — I480 Paroxysmal atrial fibrillation: Secondary | ICD-10-CM | POA: Diagnosis not present

## 2021-04-07 DIAGNOSIS — R1310 Dysphagia, unspecified: Secondary | ICD-10-CM | POA: Diagnosis not present

## 2021-04-09 DIAGNOSIS — G301 Alzheimer's disease with late onset: Secondary | ICD-10-CM | POA: Diagnosis not present

## 2021-04-09 DIAGNOSIS — I480 Paroxysmal atrial fibrillation: Secondary | ICD-10-CM | POA: Diagnosis not present

## 2021-04-09 DIAGNOSIS — Z950 Presence of cardiac pacemaker: Secondary | ICD-10-CM | POA: Diagnosis not present

## 2021-04-09 DIAGNOSIS — I495 Sick sinus syndrome: Secondary | ICD-10-CM | POA: Diagnosis not present

## 2021-04-09 DIAGNOSIS — F0280 Dementia in other diseases classified elsewhere without behavioral disturbance: Secondary | ICD-10-CM | POA: Diagnosis not present

## 2021-04-09 DIAGNOSIS — R1310 Dysphagia, unspecified: Secondary | ICD-10-CM | POA: Diagnosis not present

## 2021-04-11 DIAGNOSIS — I480 Paroxysmal atrial fibrillation: Secondary | ICD-10-CM | POA: Diagnosis not present

## 2021-04-11 DIAGNOSIS — Z950 Presence of cardiac pacemaker: Secondary | ICD-10-CM | POA: Diagnosis not present

## 2021-04-11 DIAGNOSIS — R1310 Dysphagia, unspecified: Secondary | ICD-10-CM | POA: Diagnosis not present

## 2021-04-11 DIAGNOSIS — I495 Sick sinus syndrome: Secondary | ICD-10-CM | POA: Diagnosis not present

## 2021-04-11 DIAGNOSIS — G301 Alzheimer's disease with late onset: Secondary | ICD-10-CM | POA: Diagnosis not present

## 2021-04-11 DIAGNOSIS — F0280 Dementia in other diseases classified elsewhere without behavioral disturbance: Secondary | ICD-10-CM | POA: Diagnosis not present

## 2021-04-12 DIAGNOSIS — I480 Paroxysmal atrial fibrillation: Secondary | ICD-10-CM | POA: Diagnosis not present

## 2021-04-12 DIAGNOSIS — F0280 Dementia in other diseases classified elsewhere without behavioral disturbance: Secondary | ICD-10-CM | POA: Diagnosis not present

## 2021-04-12 DIAGNOSIS — Z950 Presence of cardiac pacemaker: Secondary | ICD-10-CM | POA: Diagnosis not present

## 2021-04-12 DIAGNOSIS — I495 Sick sinus syndrome: Secondary | ICD-10-CM | POA: Diagnosis not present

## 2021-04-12 DIAGNOSIS — G301 Alzheimer's disease with late onset: Secondary | ICD-10-CM | POA: Diagnosis not present

## 2021-04-12 DIAGNOSIS — R1310 Dysphagia, unspecified: Secondary | ICD-10-CM | POA: Diagnosis not present

## 2021-04-13 DIAGNOSIS — Z950 Presence of cardiac pacemaker: Secondary | ICD-10-CM | POA: Diagnosis not present

## 2021-04-13 DIAGNOSIS — F0280 Dementia in other diseases classified elsewhere without behavioral disturbance: Secondary | ICD-10-CM | POA: Diagnosis not present

## 2021-04-13 DIAGNOSIS — I495 Sick sinus syndrome: Secondary | ICD-10-CM | POA: Diagnosis not present

## 2021-04-13 DIAGNOSIS — I480 Paroxysmal atrial fibrillation: Secondary | ICD-10-CM | POA: Diagnosis not present

## 2021-04-13 DIAGNOSIS — R1310 Dysphagia, unspecified: Secondary | ICD-10-CM | POA: Diagnosis not present

## 2021-04-13 DIAGNOSIS — G301 Alzheimer's disease with late onset: Secondary | ICD-10-CM | POA: Diagnosis not present

## 2021-04-16 DIAGNOSIS — I480 Paroxysmal atrial fibrillation: Secondary | ICD-10-CM | POA: Diagnosis not present

## 2021-04-16 DIAGNOSIS — Z950 Presence of cardiac pacemaker: Secondary | ICD-10-CM | POA: Diagnosis not present

## 2021-04-16 DIAGNOSIS — F0280 Dementia in other diseases classified elsewhere without behavioral disturbance: Secondary | ICD-10-CM | POA: Diagnosis not present

## 2021-04-16 DIAGNOSIS — R1310 Dysphagia, unspecified: Secondary | ICD-10-CM | POA: Diagnosis not present

## 2021-04-16 DIAGNOSIS — I495 Sick sinus syndrome: Secondary | ICD-10-CM | POA: Diagnosis not present

## 2021-04-16 DIAGNOSIS — G301 Alzheimer's disease with late onset: Secondary | ICD-10-CM | POA: Diagnosis not present

## 2021-04-18 DIAGNOSIS — F0280 Dementia in other diseases classified elsewhere without behavioral disturbance: Secondary | ICD-10-CM | POA: Diagnosis not present

## 2021-04-18 DIAGNOSIS — Z950 Presence of cardiac pacemaker: Secondary | ICD-10-CM | POA: Diagnosis not present

## 2021-04-18 DIAGNOSIS — G301 Alzheimer's disease with late onset: Secondary | ICD-10-CM | POA: Diagnosis not present

## 2021-04-18 DIAGNOSIS — I495 Sick sinus syndrome: Secondary | ICD-10-CM | POA: Diagnosis not present

## 2021-04-18 DIAGNOSIS — I480 Paroxysmal atrial fibrillation: Secondary | ICD-10-CM | POA: Diagnosis not present

## 2021-04-18 DIAGNOSIS — R1310 Dysphagia, unspecified: Secondary | ICD-10-CM | POA: Diagnosis not present

## 2021-04-20 DIAGNOSIS — F0280 Dementia in other diseases classified elsewhere without behavioral disturbance: Secondary | ICD-10-CM | POA: Diagnosis not present

## 2021-04-20 DIAGNOSIS — R1310 Dysphagia, unspecified: Secondary | ICD-10-CM | POA: Diagnosis not present

## 2021-04-20 DIAGNOSIS — I480 Paroxysmal atrial fibrillation: Secondary | ICD-10-CM | POA: Diagnosis not present

## 2021-04-20 DIAGNOSIS — G301 Alzheimer's disease with late onset: Secondary | ICD-10-CM | POA: Diagnosis not present

## 2021-04-20 DIAGNOSIS — I495 Sick sinus syndrome: Secondary | ICD-10-CM | POA: Diagnosis not present

## 2021-04-20 DIAGNOSIS — Z950 Presence of cardiac pacemaker: Secondary | ICD-10-CM | POA: Diagnosis not present

## 2021-04-23 DIAGNOSIS — I495 Sick sinus syndrome: Secondary | ICD-10-CM | POA: Diagnosis not present

## 2021-04-23 DIAGNOSIS — R1310 Dysphagia, unspecified: Secondary | ICD-10-CM | POA: Diagnosis not present

## 2021-04-23 DIAGNOSIS — Z950 Presence of cardiac pacemaker: Secondary | ICD-10-CM | POA: Diagnosis not present

## 2021-04-23 DIAGNOSIS — I480 Paroxysmal atrial fibrillation: Secondary | ICD-10-CM | POA: Diagnosis not present

## 2021-04-23 DIAGNOSIS — G301 Alzheimer's disease with late onset: Secondary | ICD-10-CM | POA: Diagnosis not present

## 2021-04-23 DIAGNOSIS — F0280 Dementia in other diseases classified elsewhere without behavioral disturbance: Secondary | ICD-10-CM | POA: Diagnosis not present

## 2021-04-24 DIAGNOSIS — F0280 Dementia in other diseases classified elsewhere without behavioral disturbance: Secondary | ICD-10-CM | POA: Diagnosis not present

## 2021-04-24 DIAGNOSIS — I480 Paroxysmal atrial fibrillation: Secondary | ICD-10-CM | POA: Diagnosis not present

## 2021-04-24 DIAGNOSIS — Z950 Presence of cardiac pacemaker: Secondary | ICD-10-CM | POA: Diagnosis not present

## 2021-04-24 DIAGNOSIS — G301 Alzheimer's disease with late onset: Secondary | ICD-10-CM | POA: Diagnosis not present

## 2021-04-24 DIAGNOSIS — I495 Sick sinus syndrome: Secondary | ICD-10-CM | POA: Diagnosis not present

## 2021-04-24 DIAGNOSIS — R1310 Dysphagia, unspecified: Secondary | ICD-10-CM | POA: Diagnosis not present

## 2021-04-25 DIAGNOSIS — I495 Sick sinus syndrome: Secondary | ICD-10-CM | POA: Diagnosis not present

## 2021-04-25 DIAGNOSIS — G301 Alzheimer's disease with late onset: Secondary | ICD-10-CM | POA: Diagnosis not present

## 2021-04-25 DIAGNOSIS — F0280 Dementia in other diseases classified elsewhere without behavioral disturbance: Secondary | ICD-10-CM | POA: Diagnosis not present

## 2021-04-25 DIAGNOSIS — Z950 Presence of cardiac pacemaker: Secondary | ICD-10-CM | POA: Diagnosis not present

## 2021-04-25 DIAGNOSIS — R1310 Dysphagia, unspecified: Secondary | ICD-10-CM | POA: Diagnosis not present

## 2021-04-25 DIAGNOSIS — I480 Paroxysmal atrial fibrillation: Secondary | ICD-10-CM | POA: Diagnosis not present

## 2021-04-27 DIAGNOSIS — I480 Paroxysmal atrial fibrillation: Secondary | ICD-10-CM | POA: Diagnosis not present

## 2021-04-27 DIAGNOSIS — F0280 Dementia in other diseases classified elsewhere without behavioral disturbance: Secondary | ICD-10-CM | POA: Diagnosis not present

## 2021-04-27 DIAGNOSIS — G301 Alzheimer's disease with late onset: Secondary | ICD-10-CM | POA: Diagnosis not present

## 2021-04-27 DIAGNOSIS — I495 Sick sinus syndrome: Secondary | ICD-10-CM | POA: Diagnosis not present

## 2021-04-27 DIAGNOSIS — Z950 Presence of cardiac pacemaker: Secondary | ICD-10-CM | POA: Diagnosis not present

## 2021-04-27 DIAGNOSIS — R1310 Dysphagia, unspecified: Secondary | ICD-10-CM | POA: Diagnosis not present

## 2021-04-30 DIAGNOSIS — R1310 Dysphagia, unspecified: Secondary | ICD-10-CM | POA: Diagnosis not present

## 2021-04-30 DIAGNOSIS — I495 Sick sinus syndrome: Secondary | ICD-10-CM | POA: Diagnosis not present

## 2021-04-30 DIAGNOSIS — G301 Alzheimer's disease with late onset: Secondary | ICD-10-CM | POA: Diagnosis not present

## 2021-04-30 DIAGNOSIS — I480 Paroxysmal atrial fibrillation: Secondary | ICD-10-CM | POA: Diagnosis not present

## 2021-04-30 DIAGNOSIS — F0280 Dementia in other diseases classified elsewhere without behavioral disturbance: Secondary | ICD-10-CM | POA: Diagnosis not present

## 2021-04-30 DIAGNOSIS — Z950 Presence of cardiac pacemaker: Secondary | ICD-10-CM | POA: Diagnosis not present

## 2021-05-02 DIAGNOSIS — F0280 Dementia in other diseases classified elsewhere without behavioral disturbance: Secondary | ICD-10-CM | POA: Diagnosis not present

## 2021-05-02 DIAGNOSIS — I480 Paroxysmal atrial fibrillation: Secondary | ICD-10-CM | POA: Diagnosis not present

## 2021-05-02 DIAGNOSIS — Z950 Presence of cardiac pacemaker: Secondary | ICD-10-CM | POA: Diagnosis not present

## 2021-05-02 DIAGNOSIS — K219 Gastro-esophageal reflux disease without esophagitis: Secondary | ICD-10-CM | POA: Diagnosis not present

## 2021-05-02 DIAGNOSIS — F419 Anxiety disorder, unspecified: Secondary | ICD-10-CM | POA: Diagnosis not present

## 2021-05-02 DIAGNOSIS — F32A Depression, unspecified: Secondary | ICD-10-CM | POA: Diagnosis not present

## 2021-05-02 DIAGNOSIS — Z8701 Personal history of pneumonia (recurrent): Secondary | ICD-10-CM | POA: Diagnosis not present

## 2021-05-02 DIAGNOSIS — R1310 Dysphagia, unspecified: Secondary | ICD-10-CM | POA: Diagnosis not present

## 2021-05-02 DIAGNOSIS — G301 Alzheimer's disease with late onset: Secondary | ICD-10-CM | POA: Diagnosis not present

## 2021-05-02 DIAGNOSIS — I495 Sick sinus syndrome: Secondary | ICD-10-CM | POA: Diagnosis not present

## 2021-05-02 DIAGNOSIS — J4530 Mild persistent asthma, uncomplicated: Secondary | ICD-10-CM | POA: Diagnosis not present

## 2021-05-02 DIAGNOSIS — Z8616 Personal history of COVID-19: Secondary | ICD-10-CM | POA: Diagnosis not present

## 2021-05-02 DIAGNOSIS — I10 Essential (primary) hypertension: Secondary | ICD-10-CM | POA: Diagnosis not present

## 2021-05-02 DIAGNOSIS — E441 Mild protein-calorie malnutrition: Secondary | ICD-10-CM | POA: Diagnosis not present

## 2021-05-03 DIAGNOSIS — I495 Sick sinus syndrome: Secondary | ICD-10-CM | POA: Diagnosis not present

## 2021-05-03 DIAGNOSIS — I480 Paroxysmal atrial fibrillation: Secondary | ICD-10-CM | POA: Diagnosis not present

## 2021-05-03 DIAGNOSIS — F0280 Dementia in other diseases classified elsewhere without behavioral disturbance: Secondary | ICD-10-CM | POA: Diagnosis not present

## 2021-05-03 DIAGNOSIS — Z950 Presence of cardiac pacemaker: Secondary | ICD-10-CM | POA: Diagnosis not present

## 2021-05-03 DIAGNOSIS — R1310 Dysphagia, unspecified: Secondary | ICD-10-CM | POA: Diagnosis not present

## 2021-05-03 DIAGNOSIS — G301 Alzheimer's disease with late onset: Secondary | ICD-10-CM | POA: Diagnosis not present

## 2021-05-04 DIAGNOSIS — F0280 Dementia in other diseases classified elsewhere without behavioral disturbance: Secondary | ICD-10-CM | POA: Diagnosis not present

## 2021-05-04 DIAGNOSIS — G301 Alzheimer's disease with late onset: Secondary | ICD-10-CM | POA: Diagnosis not present

## 2021-05-04 DIAGNOSIS — I495 Sick sinus syndrome: Secondary | ICD-10-CM | POA: Diagnosis not present

## 2021-05-04 DIAGNOSIS — Z950 Presence of cardiac pacemaker: Secondary | ICD-10-CM | POA: Diagnosis not present

## 2021-05-04 DIAGNOSIS — R1310 Dysphagia, unspecified: Secondary | ICD-10-CM | POA: Diagnosis not present

## 2021-05-04 DIAGNOSIS — I480 Paroxysmal atrial fibrillation: Secondary | ICD-10-CM | POA: Diagnosis not present

## 2021-05-07 DIAGNOSIS — I495 Sick sinus syndrome: Secondary | ICD-10-CM | POA: Diagnosis not present

## 2021-05-07 DIAGNOSIS — I480 Paroxysmal atrial fibrillation: Secondary | ICD-10-CM | POA: Diagnosis not present

## 2021-05-07 DIAGNOSIS — Z950 Presence of cardiac pacemaker: Secondary | ICD-10-CM | POA: Diagnosis not present

## 2021-05-07 DIAGNOSIS — G301 Alzheimer's disease with late onset: Secondary | ICD-10-CM | POA: Diagnosis not present

## 2021-05-07 DIAGNOSIS — F0280 Dementia in other diseases classified elsewhere without behavioral disturbance: Secondary | ICD-10-CM | POA: Diagnosis not present

## 2021-05-07 DIAGNOSIS — R1310 Dysphagia, unspecified: Secondary | ICD-10-CM | POA: Diagnosis not present

## 2021-05-08 DIAGNOSIS — Z950 Presence of cardiac pacemaker: Secondary | ICD-10-CM | POA: Diagnosis not present

## 2021-05-08 DIAGNOSIS — F0280 Dementia in other diseases classified elsewhere without behavioral disturbance: Secondary | ICD-10-CM | POA: Diagnosis not present

## 2021-05-08 DIAGNOSIS — R1310 Dysphagia, unspecified: Secondary | ICD-10-CM | POA: Diagnosis not present

## 2021-05-08 DIAGNOSIS — G301 Alzheimer's disease with late onset: Secondary | ICD-10-CM | POA: Diagnosis not present

## 2021-05-08 DIAGNOSIS — I495 Sick sinus syndrome: Secondary | ICD-10-CM | POA: Diagnosis not present

## 2021-05-08 DIAGNOSIS — I480 Paroxysmal atrial fibrillation: Secondary | ICD-10-CM | POA: Diagnosis not present

## 2021-05-09 DIAGNOSIS — R1310 Dysphagia, unspecified: Secondary | ICD-10-CM | POA: Diagnosis not present

## 2021-05-09 DIAGNOSIS — Z950 Presence of cardiac pacemaker: Secondary | ICD-10-CM | POA: Diagnosis not present

## 2021-05-09 DIAGNOSIS — G301 Alzheimer's disease with late onset: Secondary | ICD-10-CM | POA: Diagnosis not present

## 2021-05-09 DIAGNOSIS — I480 Paroxysmal atrial fibrillation: Secondary | ICD-10-CM | POA: Diagnosis not present

## 2021-05-09 DIAGNOSIS — I495 Sick sinus syndrome: Secondary | ICD-10-CM | POA: Diagnosis not present

## 2021-05-09 DIAGNOSIS — F0280 Dementia in other diseases classified elsewhere without behavioral disturbance: Secondary | ICD-10-CM | POA: Diagnosis not present

## 2021-05-10 DIAGNOSIS — R1310 Dysphagia, unspecified: Secondary | ICD-10-CM | POA: Diagnosis not present

## 2021-05-10 DIAGNOSIS — G301 Alzheimer's disease with late onset: Secondary | ICD-10-CM | POA: Diagnosis not present

## 2021-05-10 DIAGNOSIS — Z950 Presence of cardiac pacemaker: Secondary | ICD-10-CM | POA: Diagnosis not present

## 2021-05-10 DIAGNOSIS — I480 Paroxysmal atrial fibrillation: Secondary | ICD-10-CM | POA: Diagnosis not present

## 2021-05-10 DIAGNOSIS — F0280 Dementia in other diseases classified elsewhere without behavioral disturbance: Secondary | ICD-10-CM | POA: Diagnosis not present

## 2021-05-10 DIAGNOSIS — I495 Sick sinus syndrome: Secondary | ICD-10-CM | POA: Diagnosis not present

## 2021-05-11 DIAGNOSIS — F0280 Dementia in other diseases classified elsewhere without behavioral disturbance: Secondary | ICD-10-CM | POA: Diagnosis not present

## 2021-05-11 DIAGNOSIS — I480 Paroxysmal atrial fibrillation: Secondary | ICD-10-CM | POA: Diagnosis not present

## 2021-05-11 DIAGNOSIS — R1310 Dysphagia, unspecified: Secondary | ICD-10-CM | POA: Diagnosis not present

## 2021-05-11 DIAGNOSIS — G301 Alzheimer's disease with late onset: Secondary | ICD-10-CM | POA: Diagnosis not present

## 2021-05-11 DIAGNOSIS — I495 Sick sinus syndrome: Secondary | ICD-10-CM | POA: Diagnosis not present

## 2021-05-11 DIAGNOSIS — Z950 Presence of cardiac pacemaker: Secondary | ICD-10-CM | POA: Diagnosis not present

## 2021-05-12 DIAGNOSIS — F0280 Dementia in other diseases classified elsewhere without behavioral disturbance: Secondary | ICD-10-CM | POA: Diagnosis not present

## 2021-05-12 DIAGNOSIS — I495 Sick sinus syndrome: Secondary | ICD-10-CM | POA: Diagnosis not present

## 2021-05-12 DIAGNOSIS — I480 Paroxysmal atrial fibrillation: Secondary | ICD-10-CM | POA: Diagnosis not present

## 2021-05-12 DIAGNOSIS — Z950 Presence of cardiac pacemaker: Secondary | ICD-10-CM | POA: Diagnosis not present

## 2021-05-12 DIAGNOSIS — R1310 Dysphagia, unspecified: Secondary | ICD-10-CM | POA: Diagnosis not present

## 2021-05-12 DIAGNOSIS — G301 Alzheimer's disease with late onset: Secondary | ICD-10-CM | POA: Diagnosis not present

## 2021-05-14 DIAGNOSIS — Z950 Presence of cardiac pacemaker: Secondary | ICD-10-CM | POA: Diagnosis not present

## 2021-05-14 DIAGNOSIS — G301 Alzheimer's disease with late onset: Secondary | ICD-10-CM | POA: Diagnosis not present

## 2021-05-14 DIAGNOSIS — I495 Sick sinus syndrome: Secondary | ICD-10-CM | POA: Diagnosis not present

## 2021-05-14 DIAGNOSIS — R1310 Dysphagia, unspecified: Secondary | ICD-10-CM | POA: Diagnosis not present

## 2021-05-14 DIAGNOSIS — I480 Paroxysmal atrial fibrillation: Secondary | ICD-10-CM | POA: Diagnosis not present

## 2021-05-14 DIAGNOSIS — F0280 Dementia in other diseases classified elsewhere without behavioral disturbance: Secondary | ICD-10-CM | POA: Diagnosis not present

## 2021-05-16 DIAGNOSIS — F0280 Dementia in other diseases classified elsewhere without behavioral disturbance: Secondary | ICD-10-CM | POA: Diagnosis not present

## 2021-05-16 DIAGNOSIS — I495 Sick sinus syndrome: Secondary | ICD-10-CM | POA: Diagnosis not present

## 2021-05-16 DIAGNOSIS — R1310 Dysphagia, unspecified: Secondary | ICD-10-CM | POA: Diagnosis not present

## 2021-05-16 DIAGNOSIS — I480 Paroxysmal atrial fibrillation: Secondary | ICD-10-CM | POA: Diagnosis not present

## 2021-05-16 DIAGNOSIS — G301 Alzheimer's disease with late onset: Secondary | ICD-10-CM | POA: Diagnosis not present

## 2021-05-16 DIAGNOSIS — Z950 Presence of cardiac pacemaker: Secondary | ICD-10-CM | POA: Diagnosis not present

## 2021-05-18 DIAGNOSIS — I480 Paroxysmal atrial fibrillation: Secondary | ICD-10-CM | POA: Diagnosis not present

## 2021-05-18 DIAGNOSIS — Z950 Presence of cardiac pacemaker: Secondary | ICD-10-CM | POA: Diagnosis not present

## 2021-05-18 DIAGNOSIS — F0280 Dementia in other diseases classified elsewhere without behavioral disturbance: Secondary | ICD-10-CM | POA: Diagnosis not present

## 2021-05-18 DIAGNOSIS — I495 Sick sinus syndrome: Secondary | ICD-10-CM | POA: Diagnosis not present

## 2021-05-18 DIAGNOSIS — R1310 Dysphagia, unspecified: Secondary | ICD-10-CM | POA: Diagnosis not present

## 2021-05-18 DIAGNOSIS — G301 Alzheimer's disease with late onset: Secondary | ICD-10-CM | POA: Diagnosis not present

## 2021-05-21 DIAGNOSIS — F0280 Dementia in other diseases classified elsewhere without behavioral disturbance: Secondary | ICD-10-CM | POA: Diagnosis not present

## 2021-05-21 DIAGNOSIS — G301 Alzheimer's disease with late onset: Secondary | ICD-10-CM | POA: Diagnosis not present

## 2021-05-21 DIAGNOSIS — Z950 Presence of cardiac pacemaker: Secondary | ICD-10-CM | POA: Diagnosis not present

## 2021-05-21 DIAGNOSIS — R1310 Dysphagia, unspecified: Secondary | ICD-10-CM | POA: Diagnosis not present

## 2021-05-21 DIAGNOSIS — I495 Sick sinus syndrome: Secondary | ICD-10-CM | POA: Diagnosis not present

## 2021-05-21 DIAGNOSIS — I480 Paroxysmal atrial fibrillation: Secondary | ICD-10-CM | POA: Diagnosis not present

## 2021-05-23 DIAGNOSIS — I480 Paroxysmal atrial fibrillation: Secondary | ICD-10-CM | POA: Diagnosis not present

## 2021-05-23 DIAGNOSIS — F0280 Dementia in other diseases classified elsewhere without behavioral disturbance: Secondary | ICD-10-CM | POA: Diagnosis not present

## 2021-05-23 DIAGNOSIS — Z950 Presence of cardiac pacemaker: Secondary | ICD-10-CM | POA: Diagnosis not present

## 2021-05-23 DIAGNOSIS — R1310 Dysphagia, unspecified: Secondary | ICD-10-CM | POA: Diagnosis not present

## 2021-05-23 DIAGNOSIS — G301 Alzheimer's disease with late onset: Secondary | ICD-10-CM | POA: Diagnosis not present

## 2021-05-23 DIAGNOSIS — I495 Sick sinus syndrome: Secondary | ICD-10-CM | POA: Diagnosis not present

## 2021-05-24 DIAGNOSIS — G301 Alzheimer's disease with late onset: Secondary | ICD-10-CM | POA: Diagnosis not present

## 2021-05-24 DIAGNOSIS — R1310 Dysphagia, unspecified: Secondary | ICD-10-CM | POA: Diagnosis not present

## 2021-05-24 DIAGNOSIS — I480 Paroxysmal atrial fibrillation: Secondary | ICD-10-CM | POA: Diagnosis not present

## 2021-05-24 DIAGNOSIS — F0280 Dementia in other diseases classified elsewhere without behavioral disturbance: Secondary | ICD-10-CM | POA: Diagnosis not present

## 2021-05-24 DIAGNOSIS — I495 Sick sinus syndrome: Secondary | ICD-10-CM | POA: Diagnosis not present

## 2021-05-24 DIAGNOSIS — Z950 Presence of cardiac pacemaker: Secondary | ICD-10-CM | POA: Diagnosis not present

## 2021-05-25 DIAGNOSIS — F0280 Dementia in other diseases classified elsewhere without behavioral disturbance: Secondary | ICD-10-CM | POA: Diagnosis not present

## 2021-05-25 DIAGNOSIS — R1310 Dysphagia, unspecified: Secondary | ICD-10-CM | POA: Diagnosis not present

## 2021-05-25 DIAGNOSIS — I495 Sick sinus syndrome: Secondary | ICD-10-CM | POA: Diagnosis not present

## 2021-05-25 DIAGNOSIS — Z950 Presence of cardiac pacemaker: Secondary | ICD-10-CM | POA: Diagnosis not present

## 2021-05-25 DIAGNOSIS — G301 Alzheimer's disease with late onset: Secondary | ICD-10-CM | POA: Diagnosis not present

## 2021-05-25 DIAGNOSIS — I480 Paroxysmal atrial fibrillation: Secondary | ICD-10-CM | POA: Diagnosis not present

## 2021-05-28 DIAGNOSIS — F0280 Dementia in other diseases classified elsewhere without behavioral disturbance: Secondary | ICD-10-CM | POA: Diagnosis not present

## 2021-05-28 DIAGNOSIS — G301 Alzheimer's disease with late onset: Secondary | ICD-10-CM | POA: Diagnosis not present

## 2021-05-28 DIAGNOSIS — Z950 Presence of cardiac pacemaker: Secondary | ICD-10-CM | POA: Diagnosis not present

## 2021-05-28 DIAGNOSIS — R1310 Dysphagia, unspecified: Secondary | ICD-10-CM | POA: Diagnosis not present

## 2021-05-28 DIAGNOSIS — I480 Paroxysmal atrial fibrillation: Secondary | ICD-10-CM | POA: Diagnosis not present

## 2021-05-28 DIAGNOSIS — I495 Sick sinus syndrome: Secondary | ICD-10-CM | POA: Diagnosis not present

## 2021-05-30 DIAGNOSIS — I10 Essential (primary) hypertension: Secondary | ICD-10-CM | POA: Diagnosis not present

## 2021-05-30 DIAGNOSIS — F32A Depression, unspecified: Secondary | ICD-10-CM | POA: Diagnosis not present

## 2021-05-30 DIAGNOSIS — Z950 Presence of cardiac pacemaker: Secondary | ICD-10-CM | POA: Diagnosis not present

## 2021-05-30 DIAGNOSIS — I495 Sick sinus syndrome: Secondary | ICD-10-CM | POA: Diagnosis not present

## 2021-05-30 DIAGNOSIS — E441 Mild protein-calorie malnutrition: Secondary | ICD-10-CM | POA: Diagnosis not present

## 2021-05-30 DIAGNOSIS — E440 Moderate protein-calorie malnutrition: Secondary | ICD-10-CM | POA: Diagnosis not present

## 2021-05-30 DIAGNOSIS — I480 Paroxysmal atrial fibrillation: Secondary | ICD-10-CM | POA: Diagnosis not present

## 2021-05-30 DIAGNOSIS — Z8701 Personal history of pneumonia (recurrent): Secondary | ICD-10-CM | POA: Diagnosis not present

## 2021-05-30 DIAGNOSIS — Z8616 Personal history of COVID-19: Secondary | ICD-10-CM | POA: Diagnosis not present

## 2021-05-30 DIAGNOSIS — K219 Gastro-esophageal reflux disease without esophagitis: Secondary | ICD-10-CM | POA: Diagnosis not present

## 2021-05-30 DIAGNOSIS — R1310 Dysphagia, unspecified: Secondary | ICD-10-CM | POA: Diagnosis not present

## 2021-05-30 DIAGNOSIS — J4530 Mild persistent asthma, uncomplicated: Secondary | ICD-10-CM | POA: Diagnosis not present

## 2021-05-30 DIAGNOSIS — G301 Alzheimer's disease with late onset: Secondary | ICD-10-CM | POA: Diagnosis not present

## 2021-05-30 DIAGNOSIS — F0280 Dementia in other diseases classified elsewhere without behavioral disturbance: Secondary | ICD-10-CM | POA: Diagnosis not present

## 2021-05-30 DIAGNOSIS — F419 Anxiety disorder, unspecified: Secondary | ICD-10-CM | POA: Diagnosis not present

## 2021-05-30 DIAGNOSIS — F02811 Dementia in other diseases classified elsewhere, unspecified severity, with agitation: Secondary | ICD-10-CM | POA: Diagnosis not present

## 2021-05-31 DIAGNOSIS — F0280 Dementia in other diseases classified elsewhere without behavioral disturbance: Secondary | ICD-10-CM | POA: Diagnosis not present

## 2021-05-31 DIAGNOSIS — I480 Paroxysmal atrial fibrillation: Secondary | ICD-10-CM | POA: Diagnosis not present

## 2021-05-31 DIAGNOSIS — I495 Sick sinus syndrome: Secondary | ICD-10-CM | POA: Diagnosis not present

## 2021-05-31 DIAGNOSIS — Z950 Presence of cardiac pacemaker: Secondary | ICD-10-CM | POA: Diagnosis not present

## 2021-05-31 DIAGNOSIS — G301 Alzheimer's disease with late onset: Secondary | ICD-10-CM | POA: Diagnosis not present

## 2021-05-31 DIAGNOSIS — F02811 Dementia in other diseases classified elsewhere, unspecified severity, with agitation: Secondary | ICD-10-CM | POA: Diagnosis not present

## 2021-06-01 DIAGNOSIS — G301 Alzheimer's disease with late onset: Secondary | ICD-10-CM | POA: Diagnosis not present

## 2021-06-01 DIAGNOSIS — F0280 Dementia in other diseases classified elsewhere without behavioral disturbance: Secondary | ICD-10-CM | POA: Diagnosis not present

## 2021-06-01 DIAGNOSIS — I495 Sick sinus syndrome: Secondary | ICD-10-CM | POA: Diagnosis not present

## 2021-06-01 DIAGNOSIS — F02811 Dementia in other diseases classified elsewhere, unspecified severity, with agitation: Secondary | ICD-10-CM | POA: Diagnosis not present

## 2021-06-01 DIAGNOSIS — Z950 Presence of cardiac pacemaker: Secondary | ICD-10-CM | POA: Diagnosis not present

## 2021-06-01 DIAGNOSIS — I480 Paroxysmal atrial fibrillation: Secondary | ICD-10-CM | POA: Diagnosis not present

## 2021-06-04 DIAGNOSIS — Z950 Presence of cardiac pacemaker: Secondary | ICD-10-CM | POA: Diagnosis not present

## 2021-06-04 DIAGNOSIS — F02811 Dementia in other diseases classified elsewhere, unspecified severity, with agitation: Secondary | ICD-10-CM | POA: Diagnosis not present

## 2021-06-04 DIAGNOSIS — I495 Sick sinus syndrome: Secondary | ICD-10-CM | POA: Diagnosis not present

## 2021-06-04 DIAGNOSIS — I480 Paroxysmal atrial fibrillation: Secondary | ICD-10-CM | POA: Diagnosis not present

## 2021-06-04 DIAGNOSIS — G301 Alzheimer's disease with late onset: Secondary | ICD-10-CM | POA: Diagnosis not present

## 2021-06-04 DIAGNOSIS — F0280 Dementia in other diseases classified elsewhere without behavioral disturbance: Secondary | ICD-10-CM | POA: Diagnosis not present

## 2021-06-06 DIAGNOSIS — F0280 Dementia in other diseases classified elsewhere without behavioral disturbance: Secondary | ICD-10-CM | POA: Diagnosis not present

## 2021-06-06 DIAGNOSIS — I480 Paroxysmal atrial fibrillation: Secondary | ICD-10-CM | POA: Diagnosis not present

## 2021-06-06 DIAGNOSIS — I495 Sick sinus syndrome: Secondary | ICD-10-CM | POA: Diagnosis not present

## 2021-06-06 DIAGNOSIS — Z950 Presence of cardiac pacemaker: Secondary | ICD-10-CM | POA: Diagnosis not present

## 2021-06-06 DIAGNOSIS — F02811 Dementia in other diseases classified elsewhere, unspecified severity, with agitation: Secondary | ICD-10-CM | POA: Diagnosis not present

## 2021-06-06 DIAGNOSIS — G301 Alzheimer's disease with late onset: Secondary | ICD-10-CM | POA: Diagnosis not present

## 2021-06-07 DIAGNOSIS — F02811 Dementia in other diseases classified elsewhere, unspecified severity, with agitation: Secondary | ICD-10-CM | POA: Diagnosis not present

## 2021-06-07 DIAGNOSIS — I495 Sick sinus syndrome: Secondary | ICD-10-CM | POA: Diagnosis not present

## 2021-06-07 DIAGNOSIS — Z950 Presence of cardiac pacemaker: Secondary | ICD-10-CM | POA: Diagnosis not present

## 2021-06-07 DIAGNOSIS — I480 Paroxysmal atrial fibrillation: Secondary | ICD-10-CM | POA: Diagnosis not present

## 2021-06-07 DIAGNOSIS — F0280 Dementia in other diseases classified elsewhere without behavioral disturbance: Secondary | ICD-10-CM | POA: Diagnosis not present

## 2021-06-07 DIAGNOSIS — G301 Alzheimer's disease with late onset: Secondary | ICD-10-CM | POA: Diagnosis not present

## 2021-06-08 DIAGNOSIS — G301 Alzheimer's disease with late onset: Secondary | ICD-10-CM | POA: Diagnosis not present

## 2021-06-08 DIAGNOSIS — F02811 Dementia in other diseases classified elsewhere, unspecified severity, with agitation: Secondary | ICD-10-CM | POA: Diagnosis not present

## 2021-06-08 DIAGNOSIS — I495 Sick sinus syndrome: Secondary | ICD-10-CM | POA: Diagnosis not present

## 2021-06-08 DIAGNOSIS — I480 Paroxysmal atrial fibrillation: Secondary | ICD-10-CM | POA: Diagnosis not present

## 2021-06-08 DIAGNOSIS — Z950 Presence of cardiac pacemaker: Secondary | ICD-10-CM | POA: Diagnosis not present

## 2021-06-08 DIAGNOSIS — F0280 Dementia in other diseases classified elsewhere without behavioral disturbance: Secondary | ICD-10-CM | POA: Diagnosis not present

## 2021-06-11 DIAGNOSIS — I495 Sick sinus syndrome: Secondary | ICD-10-CM | POA: Diagnosis not present

## 2021-06-11 DIAGNOSIS — Z950 Presence of cardiac pacemaker: Secondary | ICD-10-CM | POA: Diagnosis not present

## 2021-06-11 DIAGNOSIS — F02811 Dementia in other diseases classified elsewhere, unspecified severity, with agitation: Secondary | ICD-10-CM | POA: Diagnosis not present

## 2021-06-11 DIAGNOSIS — G301 Alzheimer's disease with late onset: Secondary | ICD-10-CM | POA: Diagnosis not present

## 2021-06-11 DIAGNOSIS — I480 Paroxysmal atrial fibrillation: Secondary | ICD-10-CM | POA: Diagnosis not present

## 2021-06-11 DIAGNOSIS — F0280 Dementia in other diseases classified elsewhere without behavioral disturbance: Secondary | ICD-10-CM | POA: Diagnosis not present

## 2021-06-12 DIAGNOSIS — I480 Paroxysmal atrial fibrillation: Secondary | ICD-10-CM | POA: Diagnosis not present

## 2021-06-12 DIAGNOSIS — I495 Sick sinus syndrome: Secondary | ICD-10-CM | POA: Diagnosis not present

## 2021-06-12 DIAGNOSIS — Z950 Presence of cardiac pacemaker: Secondary | ICD-10-CM | POA: Diagnosis not present

## 2021-06-12 DIAGNOSIS — F02811 Dementia in other diseases classified elsewhere, unspecified severity, with agitation: Secondary | ICD-10-CM | POA: Diagnosis not present

## 2021-06-12 DIAGNOSIS — F0280 Dementia in other diseases classified elsewhere without behavioral disturbance: Secondary | ICD-10-CM | POA: Diagnosis not present

## 2021-06-12 DIAGNOSIS — G301 Alzheimer's disease with late onset: Secondary | ICD-10-CM | POA: Diagnosis not present

## 2021-06-13 DIAGNOSIS — Z950 Presence of cardiac pacemaker: Secondary | ICD-10-CM | POA: Diagnosis not present

## 2021-06-13 DIAGNOSIS — G301 Alzheimer's disease with late onset: Secondary | ICD-10-CM | POA: Diagnosis not present

## 2021-06-13 DIAGNOSIS — I495 Sick sinus syndrome: Secondary | ICD-10-CM | POA: Diagnosis not present

## 2021-06-13 DIAGNOSIS — F02811 Dementia in other diseases classified elsewhere, unspecified severity, with agitation: Secondary | ICD-10-CM | POA: Diagnosis not present

## 2021-06-13 DIAGNOSIS — F0280 Dementia in other diseases classified elsewhere without behavioral disturbance: Secondary | ICD-10-CM | POA: Diagnosis not present

## 2021-06-13 DIAGNOSIS — I480 Paroxysmal atrial fibrillation: Secondary | ICD-10-CM | POA: Diagnosis not present

## 2021-06-15 DIAGNOSIS — I480 Paroxysmal atrial fibrillation: Secondary | ICD-10-CM | POA: Diagnosis not present

## 2021-06-15 DIAGNOSIS — G301 Alzheimer's disease with late onset: Secondary | ICD-10-CM | POA: Diagnosis not present

## 2021-06-15 DIAGNOSIS — Z950 Presence of cardiac pacemaker: Secondary | ICD-10-CM | POA: Diagnosis not present

## 2021-06-15 DIAGNOSIS — I495 Sick sinus syndrome: Secondary | ICD-10-CM | POA: Diagnosis not present

## 2021-06-15 DIAGNOSIS — F0280 Dementia in other diseases classified elsewhere without behavioral disturbance: Secondary | ICD-10-CM | POA: Diagnosis not present

## 2021-06-15 DIAGNOSIS — F02811 Dementia in other diseases classified elsewhere, unspecified severity, with agitation: Secondary | ICD-10-CM | POA: Diagnosis not present

## 2021-06-18 DIAGNOSIS — F0280 Dementia in other diseases classified elsewhere without behavioral disturbance: Secondary | ICD-10-CM | POA: Diagnosis not present

## 2021-06-18 DIAGNOSIS — G301 Alzheimer's disease with late onset: Secondary | ICD-10-CM | POA: Diagnosis not present

## 2021-06-18 DIAGNOSIS — F02811 Dementia in other diseases classified elsewhere, unspecified severity, with agitation: Secondary | ICD-10-CM | POA: Diagnosis not present

## 2021-06-18 DIAGNOSIS — I495 Sick sinus syndrome: Secondary | ICD-10-CM | POA: Diagnosis not present

## 2021-06-18 DIAGNOSIS — I480 Paroxysmal atrial fibrillation: Secondary | ICD-10-CM | POA: Diagnosis not present

## 2021-06-18 DIAGNOSIS — Z950 Presence of cardiac pacemaker: Secondary | ICD-10-CM | POA: Diagnosis not present

## 2021-06-19 DIAGNOSIS — F0280 Dementia in other diseases classified elsewhere without behavioral disturbance: Secondary | ICD-10-CM | POA: Diagnosis not present

## 2021-06-19 DIAGNOSIS — I495 Sick sinus syndrome: Secondary | ICD-10-CM | POA: Diagnosis not present

## 2021-06-19 DIAGNOSIS — I480 Paroxysmal atrial fibrillation: Secondary | ICD-10-CM | POA: Diagnosis not present

## 2021-06-19 DIAGNOSIS — Z950 Presence of cardiac pacemaker: Secondary | ICD-10-CM | POA: Diagnosis not present

## 2021-06-19 DIAGNOSIS — G301 Alzheimer's disease with late onset: Secondary | ICD-10-CM | POA: Diagnosis not present

## 2021-06-19 DIAGNOSIS — F02811 Dementia in other diseases classified elsewhere, unspecified severity, with agitation: Secondary | ICD-10-CM | POA: Diagnosis not present

## 2021-06-20 DIAGNOSIS — I480 Paroxysmal atrial fibrillation: Secondary | ICD-10-CM | POA: Diagnosis not present

## 2021-06-20 DIAGNOSIS — Z950 Presence of cardiac pacemaker: Secondary | ICD-10-CM | POA: Diagnosis not present

## 2021-06-20 DIAGNOSIS — F02811 Dementia in other diseases classified elsewhere, unspecified severity, with agitation: Secondary | ICD-10-CM | POA: Diagnosis not present

## 2021-06-20 DIAGNOSIS — I495 Sick sinus syndrome: Secondary | ICD-10-CM | POA: Diagnosis not present

## 2021-06-20 DIAGNOSIS — F0280 Dementia in other diseases classified elsewhere without behavioral disturbance: Secondary | ICD-10-CM | POA: Diagnosis not present

## 2021-06-20 DIAGNOSIS — G301 Alzheimer's disease with late onset: Secondary | ICD-10-CM | POA: Diagnosis not present

## 2021-06-21 DIAGNOSIS — F0280 Dementia in other diseases classified elsewhere without behavioral disturbance: Secondary | ICD-10-CM | POA: Diagnosis not present

## 2021-06-21 DIAGNOSIS — Z950 Presence of cardiac pacemaker: Secondary | ICD-10-CM | POA: Diagnosis not present

## 2021-06-21 DIAGNOSIS — I480 Paroxysmal atrial fibrillation: Secondary | ICD-10-CM | POA: Diagnosis not present

## 2021-06-21 DIAGNOSIS — G301 Alzheimer's disease with late onset: Secondary | ICD-10-CM | POA: Diagnosis not present

## 2021-06-21 DIAGNOSIS — I495 Sick sinus syndrome: Secondary | ICD-10-CM | POA: Diagnosis not present

## 2021-06-21 DIAGNOSIS — F02811 Dementia in other diseases classified elsewhere, unspecified severity, with agitation: Secondary | ICD-10-CM | POA: Diagnosis not present

## 2021-06-22 DIAGNOSIS — I495 Sick sinus syndrome: Secondary | ICD-10-CM | POA: Diagnosis not present

## 2021-06-22 DIAGNOSIS — I480 Paroxysmal atrial fibrillation: Secondary | ICD-10-CM | POA: Diagnosis not present

## 2021-06-22 DIAGNOSIS — G301 Alzheimer's disease with late onset: Secondary | ICD-10-CM | POA: Diagnosis not present

## 2021-06-22 DIAGNOSIS — F0280 Dementia in other diseases classified elsewhere without behavioral disturbance: Secondary | ICD-10-CM | POA: Diagnosis not present

## 2021-06-22 DIAGNOSIS — Z950 Presence of cardiac pacemaker: Secondary | ICD-10-CM | POA: Diagnosis not present

## 2021-06-22 DIAGNOSIS — F02811 Dementia in other diseases classified elsewhere, unspecified severity, with agitation: Secondary | ICD-10-CM | POA: Diagnosis not present

## 2021-06-25 DIAGNOSIS — I480 Paroxysmal atrial fibrillation: Secondary | ICD-10-CM | POA: Diagnosis not present

## 2021-06-25 DIAGNOSIS — Z950 Presence of cardiac pacemaker: Secondary | ICD-10-CM | POA: Diagnosis not present

## 2021-06-25 DIAGNOSIS — G301 Alzheimer's disease with late onset: Secondary | ICD-10-CM | POA: Diagnosis not present

## 2021-06-25 DIAGNOSIS — F02811 Dementia in other diseases classified elsewhere, unspecified severity, with agitation: Secondary | ICD-10-CM | POA: Diagnosis not present

## 2021-06-25 DIAGNOSIS — I495 Sick sinus syndrome: Secondary | ICD-10-CM | POA: Diagnosis not present

## 2021-06-25 DIAGNOSIS — F0280 Dementia in other diseases classified elsewhere without behavioral disturbance: Secondary | ICD-10-CM | POA: Diagnosis not present

## 2021-06-27 DIAGNOSIS — I495 Sick sinus syndrome: Secondary | ICD-10-CM | POA: Diagnosis not present

## 2021-06-27 DIAGNOSIS — I480 Paroxysmal atrial fibrillation: Secondary | ICD-10-CM | POA: Diagnosis not present

## 2021-06-27 DIAGNOSIS — F0280 Dementia in other diseases classified elsewhere without behavioral disturbance: Secondary | ICD-10-CM | POA: Diagnosis not present

## 2021-06-27 DIAGNOSIS — Z950 Presence of cardiac pacemaker: Secondary | ICD-10-CM | POA: Diagnosis not present

## 2021-06-27 DIAGNOSIS — F02811 Dementia in other diseases classified elsewhere, unspecified severity, with agitation: Secondary | ICD-10-CM | POA: Diagnosis not present

## 2021-06-27 DIAGNOSIS — G301 Alzheimer's disease with late onset: Secondary | ICD-10-CM | POA: Diagnosis not present

## 2021-06-29 DIAGNOSIS — F02811 Dementia in other diseases classified elsewhere, unspecified severity, with agitation: Secondary | ICD-10-CM | POA: Diagnosis not present

## 2021-06-29 DIAGNOSIS — F0280 Dementia in other diseases classified elsewhere without behavioral disturbance: Secondary | ICD-10-CM | POA: Diagnosis not present

## 2021-06-29 DIAGNOSIS — Z950 Presence of cardiac pacemaker: Secondary | ICD-10-CM | POA: Diagnosis not present

## 2021-06-29 DIAGNOSIS — G301 Alzheimer's disease with late onset: Secondary | ICD-10-CM | POA: Diagnosis not present

## 2021-06-29 DIAGNOSIS — I480 Paroxysmal atrial fibrillation: Secondary | ICD-10-CM | POA: Diagnosis not present

## 2021-06-29 DIAGNOSIS — I495 Sick sinus syndrome: Secondary | ICD-10-CM | POA: Diagnosis not present

## 2021-06-30 DIAGNOSIS — E440 Moderate protein-calorie malnutrition: Secondary | ICD-10-CM | POA: Diagnosis not present

## 2021-06-30 DIAGNOSIS — K219 Gastro-esophageal reflux disease without esophagitis: Secondary | ICD-10-CM | POA: Diagnosis not present

## 2021-06-30 DIAGNOSIS — F32A Depression, unspecified: Secondary | ICD-10-CM | POA: Diagnosis not present

## 2021-06-30 DIAGNOSIS — Z950 Presence of cardiac pacemaker: Secondary | ICD-10-CM | POA: Diagnosis not present

## 2021-06-30 DIAGNOSIS — F419 Anxiety disorder, unspecified: Secondary | ICD-10-CM | POA: Diagnosis not present

## 2021-06-30 DIAGNOSIS — J4530 Mild persistent asthma, uncomplicated: Secondary | ICD-10-CM | POA: Diagnosis not present

## 2021-06-30 DIAGNOSIS — G301 Alzheimer's disease with late onset: Secondary | ICD-10-CM | POA: Diagnosis not present

## 2021-06-30 DIAGNOSIS — Z8701 Personal history of pneumonia (recurrent): Secondary | ICD-10-CM | POA: Diagnosis not present

## 2021-06-30 DIAGNOSIS — I480 Paroxysmal atrial fibrillation: Secondary | ICD-10-CM | POA: Diagnosis not present

## 2021-06-30 DIAGNOSIS — Z8616 Personal history of COVID-19: Secondary | ICD-10-CM | POA: Diagnosis not present

## 2021-06-30 DIAGNOSIS — I10 Essential (primary) hypertension: Secondary | ICD-10-CM | POA: Diagnosis not present

## 2021-06-30 DIAGNOSIS — I495 Sick sinus syndrome: Secondary | ICD-10-CM | POA: Diagnosis not present

## 2021-06-30 DIAGNOSIS — F02811 Dementia in other diseases classified elsewhere, unspecified severity, with agitation: Secondary | ICD-10-CM | POA: Diagnosis not present

## 2021-06-30 DIAGNOSIS — R1310 Dysphagia, unspecified: Secondary | ICD-10-CM | POA: Diagnosis not present

## 2021-07-02 DIAGNOSIS — G301 Alzheimer's disease with late onset: Secondary | ICD-10-CM | POA: Diagnosis not present

## 2021-07-02 DIAGNOSIS — F02811 Dementia in other diseases classified elsewhere, unspecified severity, with agitation: Secondary | ICD-10-CM | POA: Diagnosis not present

## 2021-07-02 DIAGNOSIS — I495 Sick sinus syndrome: Secondary | ICD-10-CM | POA: Diagnosis not present

## 2021-07-02 DIAGNOSIS — R1310 Dysphagia, unspecified: Secondary | ICD-10-CM | POA: Diagnosis not present

## 2021-07-02 DIAGNOSIS — I480 Paroxysmal atrial fibrillation: Secondary | ICD-10-CM | POA: Diagnosis not present

## 2021-07-02 DIAGNOSIS — Z950 Presence of cardiac pacemaker: Secondary | ICD-10-CM | POA: Diagnosis not present

## 2021-07-03 DIAGNOSIS — Z950 Presence of cardiac pacemaker: Secondary | ICD-10-CM | POA: Diagnosis not present

## 2021-07-03 DIAGNOSIS — G301 Alzheimer's disease with late onset: Secondary | ICD-10-CM | POA: Diagnosis not present

## 2021-07-03 DIAGNOSIS — I495 Sick sinus syndrome: Secondary | ICD-10-CM | POA: Diagnosis not present

## 2021-07-03 DIAGNOSIS — F02811 Dementia in other diseases classified elsewhere, unspecified severity, with agitation: Secondary | ICD-10-CM | POA: Diagnosis not present

## 2021-07-03 DIAGNOSIS — R1310 Dysphagia, unspecified: Secondary | ICD-10-CM | POA: Diagnosis not present

## 2021-07-03 DIAGNOSIS — I480 Paroxysmal atrial fibrillation: Secondary | ICD-10-CM | POA: Diagnosis not present

## 2021-07-04 DIAGNOSIS — I480 Paroxysmal atrial fibrillation: Secondary | ICD-10-CM | POA: Diagnosis not present

## 2021-07-04 DIAGNOSIS — I495 Sick sinus syndrome: Secondary | ICD-10-CM | POA: Diagnosis not present

## 2021-07-04 DIAGNOSIS — F02811 Dementia in other diseases classified elsewhere, unspecified severity, with agitation: Secondary | ICD-10-CM | POA: Diagnosis not present

## 2021-07-04 DIAGNOSIS — Z950 Presence of cardiac pacemaker: Secondary | ICD-10-CM | POA: Diagnosis not present

## 2021-07-04 DIAGNOSIS — R1310 Dysphagia, unspecified: Secondary | ICD-10-CM | POA: Diagnosis not present

## 2021-07-04 DIAGNOSIS — G301 Alzheimer's disease with late onset: Secondary | ICD-10-CM | POA: Diagnosis not present

## 2021-07-05 DIAGNOSIS — G301 Alzheimer's disease with late onset: Secondary | ICD-10-CM | POA: Diagnosis not present

## 2021-07-05 DIAGNOSIS — F02811 Dementia in other diseases classified elsewhere, unspecified severity, with agitation: Secondary | ICD-10-CM | POA: Diagnosis not present

## 2021-07-05 DIAGNOSIS — I480 Paroxysmal atrial fibrillation: Secondary | ICD-10-CM | POA: Diagnosis not present

## 2021-07-05 DIAGNOSIS — R1310 Dysphagia, unspecified: Secondary | ICD-10-CM | POA: Diagnosis not present

## 2021-07-05 DIAGNOSIS — Z950 Presence of cardiac pacemaker: Secondary | ICD-10-CM | POA: Diagnosis not present

## 2021-07-05 DIAGNOSIS — I495 Sick sinus syndrome: Secondary | ICD-10-CM | POA: Diagnosis not present

## 2021-07-06 DIAGNOSIS — F02811 Dementia in other diseases classified elsewhere, unspecified severity, with agitation: Secondary | ICD-10-CM | POA: Diagnosis not present

## 2021-07-06 DIAGNOSIS — I480 Paroxysmal atrial fibrillation: Secondary | ICD-10-CM | POA: Diagnosis not present

## 2021-07-06 DIAGNOSIS — R1310 Dysphagia, unspecified: Secondary | ICD-10-CM | POA: Diagnosis not present

## 2021-07-06 DIAGNOSIS — G301 Alzheimer's disease with late onset: Secondary | ICD-10-CM | POA: Diagnosis not present

## 2021-07-06 DIAGNOSIS — I495 Sick sinus syndrome: Secondary | ICD-10-CM | POA: Diagnosis not present

## 2021-07-06 DIAGNOSIS — Z20822 Contact with and (suspected) exposure to covid-19: Secondary | ICD-10-CM | POA: Diagnosis not present

## 2021-07-06 DIAGNOSIS — Z950 Presence of cardiac pacemaker: Secondary | ICD-10-CM | POA: Diagnosis not present

## 2021-07-07 DIAGNOSIS — G301 Alzheimer's disease with late onset: Secondary | ICD-10-CM | POA: Diagnosis not present

## 2021-07-07 DIAGNOSIS — F02811 Dementia in other diseases classified elsewhere, unspecified severity, with agitation: Secondary | ICD-10-CM | POA: Diagnosis not present

## 2021-07-07 DIAGNOSIS — I495 Sick sinus syndrome: Secondary | ICD-10-CM | POA: Diagnosis not present

## 2021-07-07 DIAGNOSIS — Z950 Presence of cardiac pacemaker: Secondary | ICD-10-CM | POA: Diagnosis not present

## 2021-07-07 DIAGNOSIS — Z7401 Bed confinement status: Secondary | ICD-10-CM | POA: Diagnosis not present

## 2021-07-07 DIAGNOSIS — R1310 Dysphagia, unspecified: Secondary | ICD-10-CM | POA: Diagnosis not present

## 2021-07-07 DIAGNOSIS — R404 Transient alteration of awareness: Secondary | ICD-10-CM | POA: Diagnosis not present

## 2021-07-07 DIAGNOSIS — I480 Paroxysmal atrial fibrillation: Secondary | ICD-10-CM | POA: Diagnosis not present

## 2021-07-09 DIAGNOSIS — I495 Sick sinus syndrome: Secondary | ICD-10-CM | POA: Diagnosis not present

## 2021-07-09 DIAGNOSIS — G301 Alzheimer's disease with late onset: Secondary | ICD-10-CM | POA: Diagnosis not present

## 2021-07-09 DIAGNOSIS — R1310 Dysphagia, unspecified: Secondary | ICD-10-CM | POA: Diagnosis not present

## 2021-07-09 DIAGNOSIS — Z950 Presence of cardiac pacemaker: Secondary | ICD-10-CM | POA: Diagnosis not present

## 2021-07-09 DIAGNOSIS — I480 Paroxysmal atrial fibrillation: Secondary | ICD-10-CM | POA: Diagnosis not present

## 2021-07-09 DIAGNOSIS — F02811 Dementia in other diseases classified elsewhere, unspecified severity, with agitation: Secondary | ICD-10-CM | POA: Diagnosis not present

## 2021-07-11 DIAGNOSIS — R1310 Dysphagia, unspecified: Secondary | ICD-10-CM | POA: Diagnosis not present

## 2021-07-11 DIAGNOSIS — F02811 Dementia in other diseases classified elsewhere, unspecified severity, with agitation: Secondary | ICD-10-CM | POA: Diagnosis not present

## 2021-07-11 DIAGNOSIS — Z950 Presence of cardiac pacemaker: Secondary | ICD-10-CM | POA: Diagnosis not present

## 2021-07-11 DIAGNOSIS — G301 Alzheimer's disease with late onset: Secondary | ICD-10-CM | POA: Diagnosis not present

## 2021-07-11 DIAGNOSIS — I495 Sick sinus syndrome: Secondary | ICD-10-CM | POA: Diagnosis not present

## 2021-07-11 DIAGNOSIS — I480 Paroxysmal atrial fibrillation: Secondary | ICD-10-CM | POA: Diagnosis not present

## 2021-07-12 DIAGNOSIS — G301 Alzheimer's disease with late onset: Secondary | ICD-10-CM | POA: Diagnosis not present

## 2021-07-12 DIAGNOSIS — I480 Paroxysmal atrial fibrillation: Secondary | ICD-10-CM | POA: Diagnosis not present

## 2021-07-12 DIAGNOSIS — R1310 Dysphagia, unspecified: Secondary | ICD-10-CM | POA: Diagnosis not present

## 2021-07-12 DIAGNOSIS — I495 Sick sinus syndrome: Secondary | ICD-10-CM | POA: Diagnosis not present

## 2021-07-12 DIAGNOSIS — F02811 Dementia in other diseases classified elsewhere, unspecified severity, with agitation: Secondary | ICD-10-CM | POA: Diagnosis not present

## 2021-07-12 DIAGNOSIS — Z950 Presence of cardiac pacemaker: Secondary | ICD-10-CM | POA: Diagnosis not present

## 2021-07-13 DIAGNOSIS — I495 Sick sinus syndrome: Secondary | ICD-10-CM | POA: Diagnosis not present

## 2021-07-13 DIAGNOSIS — R1310 Dysphagia, unspecified: Secondary | ICD-10-CM | POA: Diagnosis not present

## 2021-07-13 DIAGNOSIS — G301 Alzheimer's disease with late onset: Secondary | ICD-10-CM | POA: Diagnosis not present

## 2021-07-13 DIAGNOSIS — Z950 Presence of cardiac pacemaker: Secondary | ICD-10-CM | POA: Diagnosis not present

## 2021-07-13 DIAGNOSIS — F02811 Dementia in other diseases classified elsewhere, unspecified severity, with agitation: Secondary | ICD-10-CM | POA: Diagnosis not present

## 2021-07-13 DIAGNOSIS — I480 Paroxysmal atrial fibrillation: Secondary | ICD-10-CM | POA: Diagnosis not present

## 2021-07-14 DIAGNOSIS — I480 Paroxysmal atrial fibrillation: Secondary | ICD-10-CM | POA: Diagnosis not present

## 2021-07-14 DIAGNOSIS — R1310 Dysphagia, unspecified: Secondary | ICD-10-CM | POA: Diagnosis not present

## 2021-07-14 DIAGNOSIS — Z950 Presence of cardiac pacemaker: Secondary | ICD-10-CM | POA: Diagnosis not present

## 2021-07-14 DIAGNOSIS — F02811 Dementia in other diseases classified elsewhere, unspecified severity, with agitation: Secondary | ICD-10-CM | POA: Diagnosis not present

## 2021-07-14 DIAGNOSIS — I495 Sick sinus syndrome: Secondary | ICD-10-CM | POA: Diagnosis not present

## 2021-07-14 DIAGNOSIS — G301 Alzheimer's disease with late onset: Secondary | ICD-10-CM | POA: Diagnosis not present

## 2021-07-16 DIAGNOSIS — R1310 Dysphagia, unspecified: Secondary | ICD-10-CM | POA: Diagnosis not present

## 2021-07-16 DIAGNOSIS — G301 Alzheimer's disease with late onset: Secondary | ICD-10-CM | POA: Diagnosis not present

## 2021-07-16 DIAGNOSIS — I480 Paroxysmal atrial fibrillation: Secondary | ICD-10-CM | POA: Diagnosis not present

## 2021-07-16 DIAGNOSIS — F02811 Dementia in other diseases classified elsewhere, unspecified severity, with agitation: Secondary | ICD-10-CM | POA: Diagnosis not present

## 2021-07-16 DIAGNOSIS — I495 Sick sinus syndrome: Secondary | ICD-10-CM | POA: Diagnosis not present

## 2021-07-16 DIAGNOSIS — Z950 Presence of cardiac pacemaker: Secondary | ICD-10-CM | POA: Diagnosis not present

## 2021-07-16 DIAGNOSIS — Z20822 Contact with and (suspected) exposure to covid-19: Secondary | ICD-10-CM | POA: Diagnosis not present

## 2021-07-18 DIAGNOSIS — R1310 Dysphagia, unspecified: Secondary | ICD-10-CM | POA: Diagnosis not present

## 2021-07-18 DIAGNOSIS — G301 Alzheimer's disease with late onset: Secondary | ICD-10-CM | POA: Diagnosis not present

## 2021-07-18 DIAGNOSIS — I480 Paroxysmal atrial fibrillation: Secondary | ICD-10-CM | POA: Diagnosis not present

## 2021-07-18 DIAGNOSIS — Z950 Presence of cardiac pacemaker: Secondary | ICD-10-CM | POA: Diagnosis not present

## 2021-07-18 DIAGNOSIS — I495 Sick sinus syndrome: Secondary | ICD-10-CM | POA: Diagnosis not present

## 2021-07-18 DIAGNOSIS — F02811 Dementia in other diseases classified elsewhere, unspecified severity, with agitation: Secondary | ICD-10-CM | POA: Diagnosis not present

## 2021-07-19 DIAGNOSIS — F02811 Dementia in other diseases classified elsewhere, unspecified severity, with agitation: Secondary | ICD-10-CM | POA: Diagnosis not present

## 2021-07-19 DIAGNOSIS — Z950 Presence of cardiac pacemaker: Secondary | ICD-10-CM | POA: Diagnosis not present

## 2021-07-19 DIAGNOSIS — I495 Sick sinus syndrome: Secondary | ICD-10-CM | POA: Diagnosis not present

## 2021-07-19 DIAGNOSIS — G301 Alzheimer's disease with late onset: Secondary | ICD-10-CM | POA: Diagnosis not present

## 2021-07-19 DIAGNOSIS — R1310 Dysphagia, unspecified: Secondary | ICD-10-CM | POA: Diagnosis not present

## 2021-07-19 DIAGNOSIS — I480 Paroxysmal atrial fibrillation: Secondary | ICD-10-CM | POA: Diagnosis not present

## 2021-07-20 DIAGNOSIS — R1310 Dysphagia, unspecified: Secondary | ICD-10-CM | POA: Diagnosis not present

## 2021-07-20 DIAGNOSIS — Z950 Presence of cardiac pacemaker: Secondary | ICD-10-CM | POA: Diagnosis not present

## 2021-07-20 DIAGNOSIS — I480 Paroxysmal atrial fibrillation: Secondary | ICD-10-CM | POA: Diagnosis not present

## 2021-07-20 DIAGNOSIS — G301 Alzheimer's disease with late onset: Secondary | ICD-10-CM | POA: Diagnosis not present

## 2021-07-20 DIAGNOSIS — I495 Sick sinus syndrome: Secondary | ICD-10-CM | POA: Diagnosis not present

## 2021-07-20 DIAGNOSIS — F02811 Dementia in other diseases classified elsewhere, unspecified severity, with agitation: Secondary | ICD-10-CM | POA: Diagnosis not present

## 2021-07-23 DIAGNOSIS — I480 Paroxysmal atrial fibrillation: Secondary | ICD-10-CM | POA: Diagnosis not present

## 2021-07-23 DIAGNOSIS — R1310 Dysphagia, unspecified: Secondary | ICD-10-CM | POA: Diagnosis not present

## 2021-07-23 DIAGNOSIS — Z950 Presence of cardiac pacemaker: Secondary | ICD-10-CM | POA: Diagnosis not present

## 2021-07-23 DIAGNOSIS — I495 Sick sinus syndrome: Secondary | ICD-10-CM | POA: Diagnosis not present

## 2021-07-23 DIAGNOSIS — G301 Alzheimer's disease with late onset: Secondary | ICD-10-CM | POA: Diagnosis not present

## 2021-07-23 DIAGNOSIS — F02811 Dementia in other diseases classified elsewhere, unspecified severity, with agitation: Secondary | ICD-10-CM | POA: Diagnosis not present

## 2021-07-25 DIAGNOSIS — F02811 Dementia in other diseases classified elsewhere, unspecified severity, with agitation: Secondary | ICD-10-CM | POA: Diagnosis not present

## 2021-07-25 DIAGNOSIS — Z950 Presence of cardiac pacemaker: Secondary | ICD-10-CM | POA: Diagnosis not present

## 2021-07-25 DIAGNOSIS — I480 Paroxysmal atrial fibrillation: Secondary | ICD-10-CM | POA: Diagnosis not present

## 2021-07-25 DIAGNOSIS — R1310 Dysphagia, unspecified: Secondary | ICD-10-CM | POA: Diagnosis not present

## 2021-07-25 DIAGNOSIS — G301 Alzheimer's disease with late onset: Secondary | ICD-10-CM | POA: Diagnosis not present

## 2021-07-25 DIAGNOSIS — I495 Sick sinus syndrome: Secondary | ICD-10-CM | POA: Diagnosis not present

## 2021-07-26 DIAGNOSIS — G301 Alzheimer's disease with late onset: Secondary | ICD-10-CM | POA: Diagnosis not present

## 2021-07-26 DIAGNOSIS — R1310 Dysphagia, unspecified: Secondary | ICD-10-CM | POA: Diagnosis not present

## 2021-07-26 DIAGNOSIS — F02811 Dementia in other diseases classified elsewhere, unspecified severity, with agitation: Secondary | ICD-10-CM | POA: Diagnosis not present

## 2021-07-26 DIAGNOSIS — I495 Sick sinus syndrome: Secondary | ICD-10-CM | POA: Diagnosis not present

## 2021-07-26 DIAGNOSIS — Z950 Presence of cardiac pacemaker: Secondary | ICD-10-CM | POA: Diagnosis not present

## 2021-07-26 DIAGNOSIS — I480 Paroxysmal atrial fibrillation: Secondary | ICD-10-CM | POA: Diagnosis not present

## 2021-07-27 DIAGNOSIS — R1310 Dysphagia, unspecified: Secondary | ICD-10-CM | POA: Diagnosis not present

## 2021-07-27 DIAGNOSIS — I495 Sick sinus syndrome: Secondary | ICD-10-CM | POA: Diagnosis not present

## 2021-07-27 DIAGNOSIS — F02811 Dementia in other diseases classified elsewhere, unspecified severity, with agitation: Secondary | ICD-10-CM | POA: Diagnosis not present

## 2021-07-27 DIAGNOSIS — I480 Paroxysmal atrial fibrillation: Secondary | ICD-10-CM | POA: Diagnosis not present

## 2021-07-27 DIAGNOSIS — Z950 Presence of cardiac pacemaker: Secondary | ICD-10-CM | POA: Diagnosis not present

## 2021-07-27 DIAGNOSIS — G301 Alzheimer's disease with late onset: Secondary | ICD-10-CM | POA: Diagnosis not present

## 2021-07-30 DIAGNOSIS — I10 Essential (primary) hypertension: Secondary | ICD-10-CM | POA: Diagnosis not present

## 2021-07-30 DIAGNOSIS — J4530 Mild persistent asthma, uncomplicated: Secondary | ICD-10-CM | POA: Diagnosis not present

## 2021-07-30 DIAGNOSIS — E440 Moderate protein-calorie malnutrition: Secondary | ICD-10-CM | POA: Diagnosis not present

## 2021-07-30 DIAGNOSIS — I480 Paroxysmal atrial fibrillation: Secondary | ICD-10-CM | POA: Diagnosis not present

## 2021-07-30 DIAGNOSIS — I495 Sick sinus syndrome: Secondary | ICD-10-CM | POA: Diagnosis not present

## 2021-07-30 DIAGNOSIS — F32A Depression, unspecified: Secondary | ICD-10-CM | POA: Diagnosis not present

## 2021-07-30 DIAGNOSIS — G301 Alzheimer's disease with late onset: Secondary | ICD-10-CM | POA: Diagnosis not present

## 2021-07-30 DIAGNOSIS — F419 Anxiety disorder, unspecified: Secondary | ICD-10-CM | POA: Diagnosis not present

## 2021-07-30 DIAGNOSIS — Z8701 Personal history of pneumonia (recurrent): Secondary | ICD-10-CM | POA: Diagnosis not present

## 2021-07-30 DIAGNOSIS — R1310 Dysphagia, unspecified: Secondary | ICD-10-CM | POA: Diagnosis not present

## 2021-07-30 DIAGNOSIS — Z8616 Personal history of COVID-19: Secondary | ICD-10-CM | POA: Diagnosis not present

## 2021-07-30 DIAGNOSIS — F02811 Dementia in other diseases classified elsewhere, unspecified severity, with agitation: Secondary | ICD-10-CM | POA: Diagnosis not present

## 2021-07-30 DIAGNOSIS — Z950 Presence of cardiac pacemaker: Secondary | ICD-10-CM | POA: Diagnosis not present

## 2021-07-30 DIAGNOSIS — K219 Gastro-esophageal reflux disease without esophagitis: Secondary | ICD-10-CM | POA: Diagnosis not present

## 2021-08-01 DIAGNOSIS — Z950 Presence of cardiac pacemaker: Secondary | ICD-10-CM | POA: Diagnosis not present

## 2021-08-01 DIAGNOSIS — R1310 Dysphagia, unspecified: Secondary | ICD-10-CM | POA: Diagnosis not present

## 2021-08-01 DIAGNOSIS — G301 Alzheimer's disease with late onset: Secondary | ICD-10-CM | POA: Diagnosis not present

## 2021-08-01 DIAGNOSIS — I495 Sick sinus syndrome: Secondary | ICD-10-CM | POA: Diagnosis not present

## 2021-08-01 DIAGNOSIS — I480 Paroxysmal atrial fibrillation: Secondary | ICD-10-CM | POA: Diagnosis not present

## 2021-08-01 DIAGNOSIS — F02811 Dementia in other diseases classified elsewhere, unspecified severity, with agitation: Secondary | ICD-10-CM | POA: Diagnosis not present

## 2021-08-02 DIAGNOSIS — R1310 Dysphagia, unspecified: Secondary | ICD-10-CM | POA: Diagnosis not present

## 2021-08-02 DIAGNOSIS — I480 Paroxysmal atrial fibrillation: Secondary | ICD-10-CM | POA: Diagnosis not present

## 2021-08-02 DIAGNOSIS — I495 Sick sinus syndrome: Secondary | ICD-10-CM | POA: Diagnosis not present

## 2021-08-02 DIAGNOSIS — F02811 Dementia in other diseases classified elsewhere, unspecified severity, with agitation: Secondary | ICD-10-CM | POA: Diagnosis not present

## 2021-08-02 DIAGNOSIS — G301 Alzheimer's disease with late onset: Secondary | ICD-10-CM | POA: Diagnosis not present

## 2021-08-02 DIAGNOSIS — Z950 Presence of cardiac pacemaker: Secondary | ICD-10-CM | POA: Diagnosis not present

## 2021-08-03 DIAGNOSIS — R1310 Dysphagia, unspecified: Secondary | ICD-10-CM | POA: Diagnosis not present

## 2021-08-03 DIAGNOSIS — F02811 Dementia in other diseases classified elsewhere, unspecified severity, with agitation: Secondary | ICD-10-CM | POA: Diagnosis not present

## 2021-08-03 DIAGNOSIS — I495 Sick sinus syndrome: Secondary | ICD-10-CM | POA: Diagnosis not present

## 2021-08-03 DIAGNOSIS — G301 Alzheimer's disease with late onset: Secondary | ICD-10-CM | POA: Diagnosis not present

## 2021-08-03 DIAGNOSIS — I480 Paroxysmal atrial fibrillation: Secondary | ICD-10-CM | POA: Diagnosis not present

## 2021-08-03 DIAGNOSIS — Z950 Presence of cardiac pacemaker: Secondary | ICD-10-CM | POA: Diagnosis not present

## 2021-08-06 DIAGNOSIS — Z950 Presence of cardiac pacemaker: Secondary | ICD-10-CM | POA: Diagnosis not present

## 2021-08-06 DIAGNOSIS — I480 Paroxysmal atrial fibrillation: Secondary | ICD-10-CM | POA: Diagnosis not present

## 2021-08-06 DIAGNOSIS — I495 Sick sinus syndrome: Secondary | ICD-10-CM | POA: Diagnosis not present

## 2021-08-06 DIAGNOSIS — R1310 Dysphagia, unspecified: Secondary | ICD-10-CM | POA: Diagnosis not present

## 2021-08-06 DIAGNOSIS — F02811 Dementia in other diseases classified elsewhere, unspecified severity, with agitation: Secondary | ICD-10-CM | POA: Diagnosis not present

## 2021-08-06 DIAGNOSIS — G301 Alzheimer's disease with late onset: Secondary | ICD-10-CM | POA: Diagnosis not present

## 2021-08-07 DIAGNOSIS — I480 Paroxysmal atrial fibrillation: Secondary | ICD-10-CM | POA: Diagnosis not present

## 2021-08-07 DIAGNOSIS — I495 Sick sinus syndrome: Secondary | ICD-10-CM | POA: Diagnosis not present

## 2021-08-07 DIAGNOSIS — Z950 Presence of cardiac pacemaker: Secondary | ICD-10-CM | POA: Diagnosis not present

## 2021-08-07 DIAGNOSIS — F02811 Dementia in other diseases classified elsewhere, unspecified severity, with agitation: Secondary | ICD-10-CM | POA: Diagnosis not present

## 2021-08-07 DIAGNOSIS — G301 Alzheimer's disease with late onset: Secondary | ICD-10-CM | POA: Diagnosis not present

## 2021-08-07 DIAGNOSIS — R1310 Dysphagia, unspecified: Secondary | ICD-10-CM | POA: Diagnosis not present

## 2021-08-08 DIAGNOSIS — F02811 Dementia in other diseases classified elsewhere, unspecified severity, with agitation: Secondary | ICD-10-CM | POA: Diagnosis not present

## 2021-08-08 DIAGNOSIS — I495 Sick sinus syndrome: Secondary | ICD-10-CM | POA: Diagnosis not present

## 2021-08-08 DIAGNOSIS — G301 Alzheimer's disease with late onset: Secondary | ICD-10-CM | POA: Diagnosis not present

## 2021-08-08 DIAGNOSIS — Z950 Presence of cardiac pacemaker: Secondary | ICD-10-CM | POA: Diagnosis not present

## 2021-08-08 DIAGNOSIS — R1310 Dysphagia, unspecified: Secondary | ICD-10-CM | POA: Diagnosis not present

## 2021-08-08 DIAGNOSIS — I480 Paroxysmal atrial fibrillation: Secondary | ICD-10-CM | POA: Diagnosis not present

## 2021-08-09 DIAGNOSIS — F02811 Dementia in other diseases classified elsewhere, unspecified severity, with agitation: Secondary | ICD-10-CM | POA: Diagnosis not present

## 2021-08-09 DIAGNOSIS — G301 Alzheimer's disease with late onset: Secondary | ICD-10-CM | POA: Diagnosis not present

## 2021-08-09 DIAGNOSIS — Z950 Presence of cardiac pacemaker: Secondary | ICD-10-CM | POA: Diagnosis not present

## 2021-08-09 DIAGNOSIS — R1310 Dysphagia, unspecified: Secondary | ICD-10-CM | POA: Diagnosis not present

## 2021-08-09 DIAGNOSIS — I495 Sick sinus syndrome: Secondary | ICD-10-CM | POA: Diagnosis not present

## 2021-08-09 DIAGNOSIS — I480 Paroxysmal atrial fibrillation: Secondary | ICD-10-CM | POA: Diagnosis not present

## 2021-08-10 DIAGNOSIS — I480 Paroxysmal atrial fibrillation: Secondary | ICD-10-CM | POA: Diagnosis not present

## 2021-08-10 DIAGNOSIS — Z950 Presence of cardiac pacemaker: Secondary | ICD-10-CM | POA: Diagnosis not present

## 2021-08-10 DIAGNOSIS — I495 Sick sinus syndrome: Secondary | ICD-10-CM | POA: Diagnosis not present

## 2021-08-10 DIAGNOSIS — R1310 Dysphagia, unspecified: Secondary | ICD-10-CM | POA: Diagnosis not present

## 2021-08-10 DIAGNOSIS — G301 Alzheimer's disease with late onset: Secondary | ICD-10-CM | POA: Diagnosis not present

## 2021-08-10 DIAGNOSIS — F02811 Dementia in other diseases classified elsewhere, unspecified severity, with agitation: Secondary | ICD-10-CM | POA: Diagnosis not present

## 2021-08-11 DIAGNOSIS — I480 Paroxysmal atrial fibrillation: Secondary | ICD-10-CM | POA: Diagnosis not present

## 2021-08-11 DIAGNOSIS — G301 Alzheimer's disease with late onset: Secondary | ICD-10-CM | POA: Diagnosis not present

## 2021-08-11 DIAGNOSIS — R1310 Dysphagia, unspecified: Secondary | ICD-10-CM | POA: Diagnosis not present

## 2021-08-11 DIAGNOSIS — I495 Sick sinus syndrome: Secondary | ICD-10-CM | POA: Diagnosis not present

## 2021-08-11 DIAGNOSIS — F02811 Dementia in other diseases classified elsewhere, unspecified severity, with agitation: Secondary | ICD-10-CM | POA: Diagnosis not present

## 2021-08-11 DIAGNOSIS — Z950 Presence of cardiac pacemaker: Secondary | ICD-10-CM | POA: Diagnosis not present

## 2021-08-12 DIAGNOSIS — R1310 Dysphagia, unspecified: Secondary | ICD-10-CM | POA: Diagnosis not present

## 2021-08-12 DIAGNOSIS — I480 Paroxysmal atrial fibrillation: Secondary | ICD-10-CM | POA: Diagnosis not present

## 2021-08-12 DIAGNOSIS — F02811 Dementia in other diseases classified elsewhere, unspecified severity, with agitation: Secondary | ICD-10-CM | POA: Diagnosis not present

## 2021-08-12 DIAGNOSIS — I495 Sick sinus syndrome: Secondary | ICD-10-CM | POA: Diagnosis not present

## 2021-08-12 DIAGNOSIS — Z950 Presence of cardiac pacemaker: Secondary | ICD-10-CM | POA: Diagnosis not present

## 2021-08-12 DIAGNOSIS — G301 Alzheimer's disease with late onset: Secondary | ICD-10-CM | POA: Diagnosis not present

## 2021-08-13 DIAGNOSIS — I480 Paroxysmal atrial fibrillation: Secondary | ICD-10-CM | POA: Diagnosis not present

## 2021-08-13 DIAGNOSIS — F02811 Dementia in other diseases classified elsewhere, unspecified severity, with agitation: Secondary | ICD-10-CM | POA: Diagnosis not present

## 2021-08-13 DIAGNOSIS — G301 Alzheimer's disease with late onset: Secondary | ICD-10-CM | POA: Diagnosis not present

## 2021-08-13 DIAGNOSIS — Z950 Presence of cardiac pacemaker: Secondary | ICD-10-CM | POA: Diagnosis not present

## 2021-08-13 DIAGNOSIS — I495 Sick sinus syndrome: Secondary | ICD-10-CM | POA: Diagnosis not present

## 2021-08-13 DIAGNOSIS — R1310 Dysphagia, unspecified: Secondary | ICD-10-CM | POA: Diagnosis not present

## 2021-08-14 DIAGNOSIS — I480 Paroxysmal atrial fibrillation: Secondary | ICD-10-CM | POA: Diagnosis not present

## 2021-08-14 DIAGNOSIS — F02811 Dementia in other diseases classified elsewhere, unspecified severity, with agitation: Secondary | ICD-10-CM | POA: Diagnosis not present

## 2021-08-14 DIAGNOSIS — I495 Sick sinus syndrome: Secondary | ICD-10-CM | POA: Diagnosis not present

## 2021-08-14 DIAGNOSIS — R1310 Dysphagia, unspecified: Secondary | ICD-10-CM | POA: Diagnosis not present

## 2021-08-14 DIAGNOSIS — Z950 Presence of cardiac pacemaker: Secondary | ICD-10-CM | POA: Diagnosis not present

## 2021-08-14 DIAGNOSIS — G301 Alzheimer's disease with late onset: Secondary | ICD-10-CM | POA: Diagnosis not present

## 2021-08-15 DIAGNOSIS — F02811 Dementia in other diseases classified elsewhere, unspecified severity, with agitation: Secondary | ICD-10-CM | POA: Diagnosis not present

## 2021-08-15 DIAGNOSIS — I495 Sick sinus syndrome: Secondary | ICD-10-CM | POA: Diagnosis not present

## 2021-08-15 DIAGNOSIS — I480 Paroxysmal atrial fibrillation: Secondary | ICD-10-CM | POA: Diagnosis not present

## 2021-08-15 DIAGNOSIS — G301 Alzheimer's disease with late onset: Secondary | ICD-10-CM | POA: Diagnosis not present

## 2021-08-15 DIAGNOSIS — R1310 Dysphagia, unspecified: Secondary | ICD-10-CM | POA: Diagnosis not present

## 2021-08-15 DIAGNOSIS — Z950 Presence of cardiac pacemaker: Secondary | ICD-10-CM | POA: Diagnosis not present

## 2021-08-30 DEATH — deceased

## 2021-12-15 IMAGING — CT CT CHEST W/O CM
1 of 3 series · 12 of 32 positions shown, 15 images · non-contrast
Comparison: 06/27/2020 chest radiograph.

CLINICAL DATA: Cough. Concern for aspiration pneumonia. Dementia.
COPD.

EXAM:
CT CHEST WITHOUT CONTRAST
TECHNIQUE: Multidetector CT imaging of the chest was performed following the
standard protocol without IV contrast.

[Series 5: chest wo · axial · 0.66mm/px · z∈[-136,+132]mm · 12 of 160 slices shown, 15 images]
[im 13/160  mediastinal]
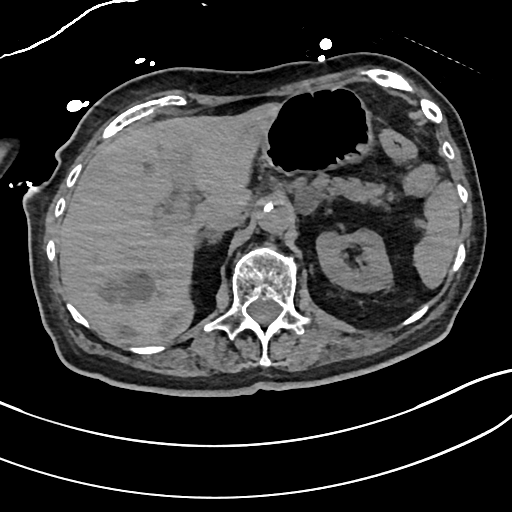
[im 13/160  lung]
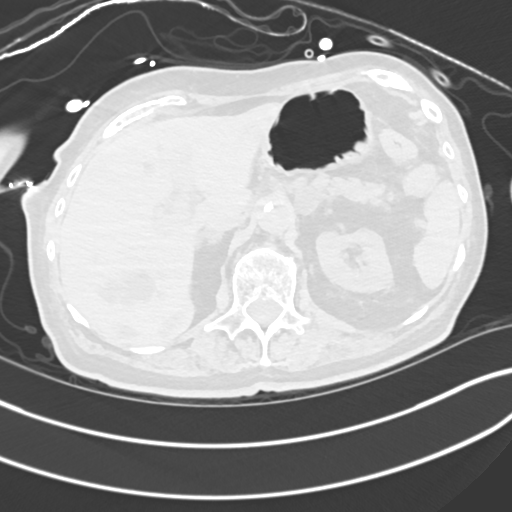
[im 25/160  lung]
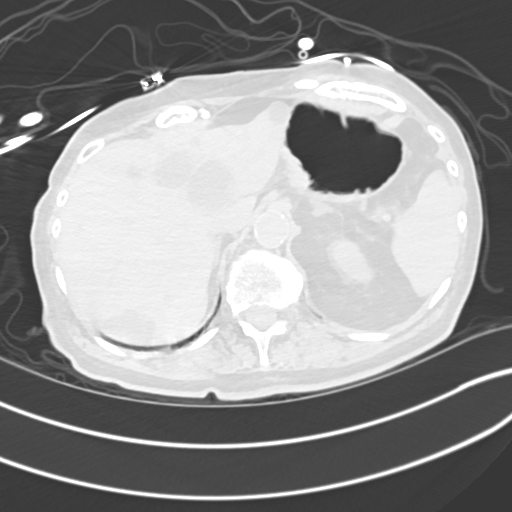
[im 37/160  lung]
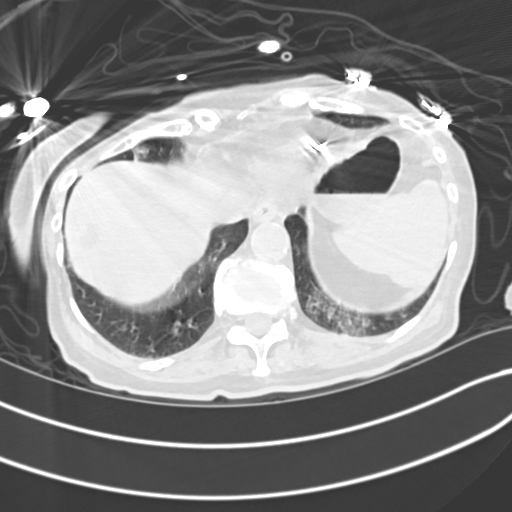
[im 49/160  lung]
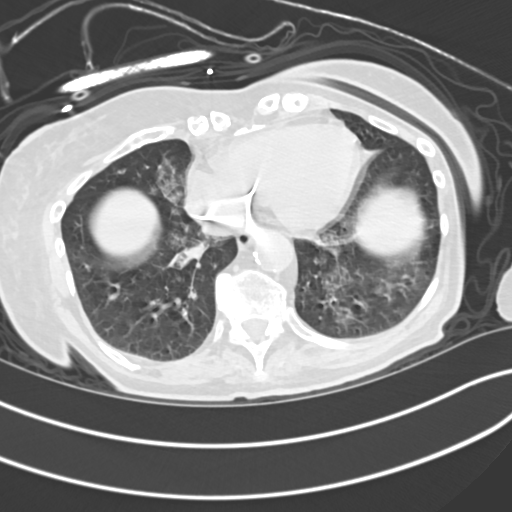
[im 62/160  mediastinal]
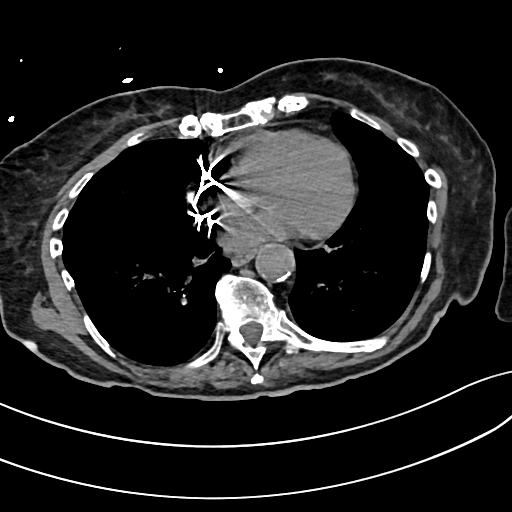
[im 62/160  lung]
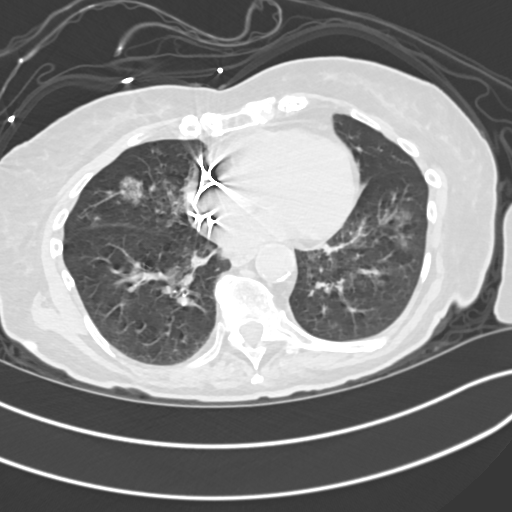
[im 74/160  lung]
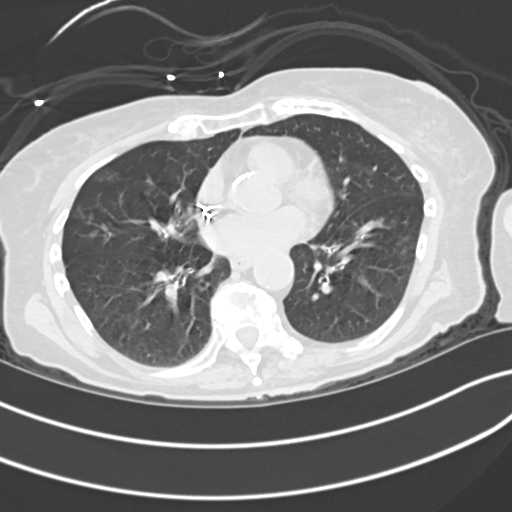
[im 86/160  lung]
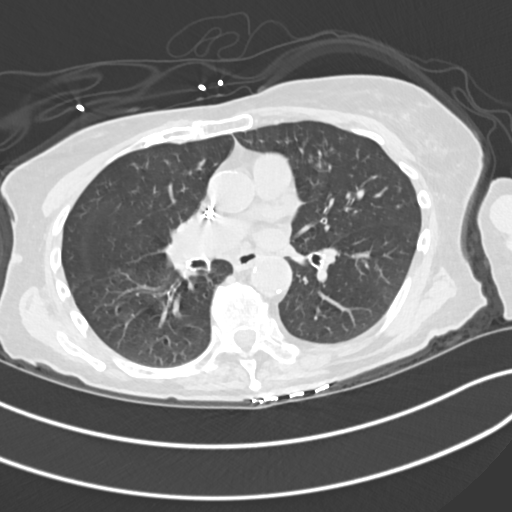
[im 98/160  lung]
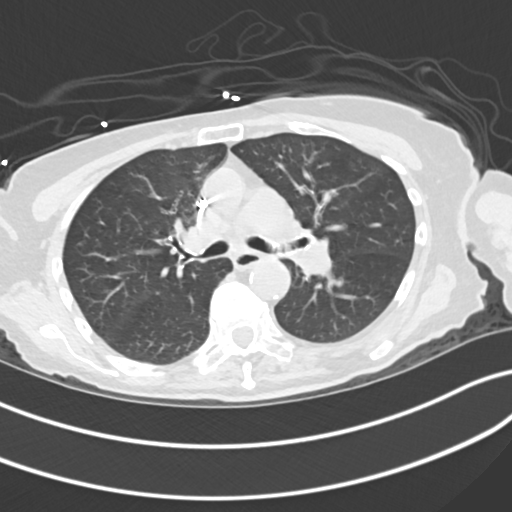
[im 111/160  mediastinal]
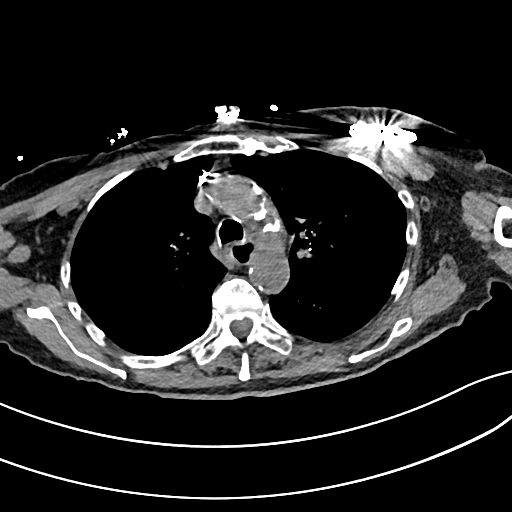
[im 111/160  lung]
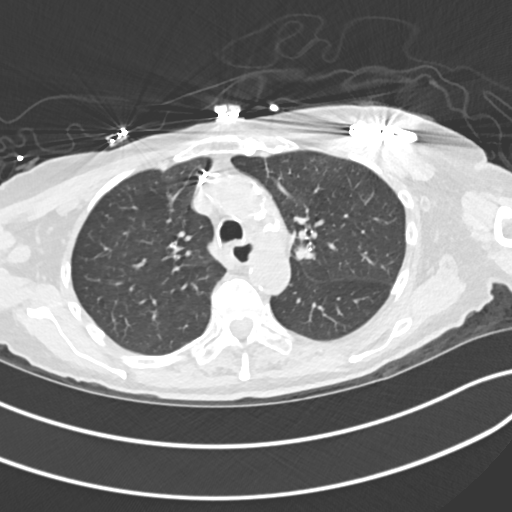
[im 123/160  lung]
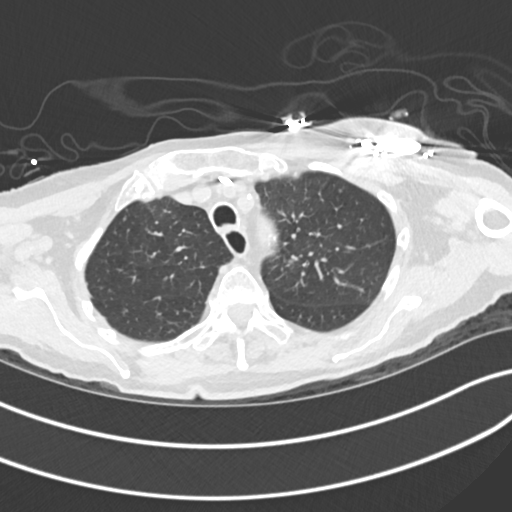
[im 135/160  lung]
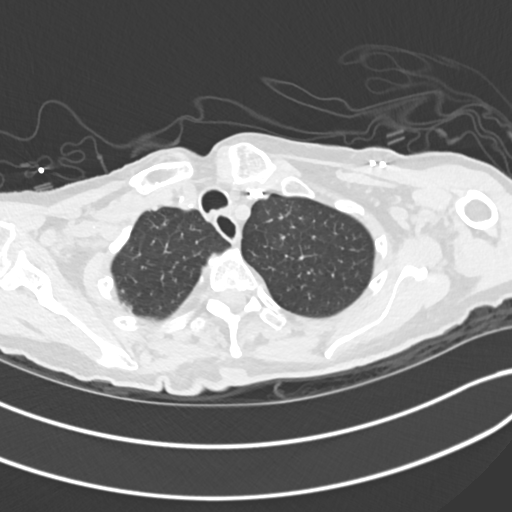
[im 147/160  lung]
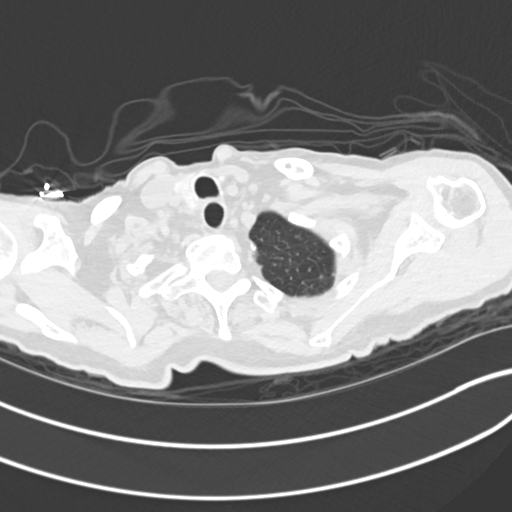

[12 of 32 positions shown; findings below may reference images not displayed]

FINDINGS: Cardiovascular: Mild motion degradation throughout. Aortic
atherosclerosis. Pacer. Lad coronary artery calcification.

Mediastinum/Nodes: Mildly prominent, macrolobulated right lobe of
the thyroid suggests multinodular goiter. No mediastinal or definite
hilar adenopathy, given limitations of unenhanced CT.

Lungs/Pleura: No pleural fluid.  Mild centrilobular emphysema.

Within both lower lobes, inferior right middle lobe, minimally
within the lingula, and within the right apex are areas of
ground-glass and peribronchovascular airspace.

Upper Abdomen: Multiple hepatic cysts. Cholecystectomy. Artifact
degradation from patient right greater than left hand position.
Normal imaged portions of the spleen, stomach, pancreas, left
kidney. 1.8 cm left adrenal low-density nodule including on 146/5. A
1.2 cm right adrenal nodule has indeterminate density measurements
but is statistically most likely an adenoma. Abdominal aortic
atherosclerosis.

Musculoskeletal: No acute osseous abnormality.
IMPRESSION: 1. Multifactorial degradation, including positioning and motion.
2. Multifocal ground-glass and peribronchovascular airspace
opacities, consistent with (given clinical history) aspiration
versus infection.
3. Aortic atherosclerosis (S346I-NPR.R), coronary artery
atherosclerosis and emphysema (S346I-5Z7.B).
4. Left adrenal adenoma. Right adrenal nodule is technically
indeterminate but most likely also an adenoma.
